# Patient Record
Sex: Female | Born: 1975 | Race: White | Hispanic: Yes | Marital: Married | Smoking: Never smoker
Health system: Southern US, Community
[De-identification: ages and names within clinical notes are randomized; demographics above are authoritative.]

## PROBLEM LIST (undated history)

## (undated) ENCOUNTER — Emergency Department (HOSPITAL_COMMUNITY): Discharge status: Against Medical Advice | Payer: Self-pay

## (undated) DIAGNOSIS — E119 Type 2 diabetes mellitus without complications: Secondary | ICD-10-CM

## (undated) DIAGNOSIS — K8000 Calculus of gallbladder with acute cholecystitis without obstruction: Secondary | ICD-10-CM

## (undated) DIAGNOSIS — I10 Essential (primary) hypertension: Secondary | ICD-10-CM

---

## 2001-05-03 ENCOUNTER — Emergency Department (HOSPITAL_COMMUNITY): Admission: EM | Admit: 2001-05-03 | Discharge: 2001-05-03 | Payer: Self-pay

## 2005-02-28 ENCOUNTER — Ambulatory Visit: Payer: Self-pay | Admitting: *Deleted

## 2005-02-28 ENCOUNTER — Encounter: Payer: Self-pay | Admitting: Obstetrics & Gynecology

## 2005-04-19 ENCOUNTER — Ambulatory Visit: Payer: Self-pay | Admitting: Family Medicine

## 2005-11-27 ENCOUNTER — Ambulatory Visit: Payer: Self-pay | Admitting: Family Medicine

## 2005-11-28 ENCOUNTER — Ambulatory Visit (HOSPITAL_COMMUNITY): Admission: RE | Admit: 2005-11-28 | Discharge: 2005-11-28 | Payer: Self-pay | Admitting: Family Medicine

## 2006-02-08 ENCOUNTER — Encounter: Payer: Self-pay | Admitting: Emergency Medicine

## 2006-02-08 ENCOUNTER — Inpatient Hospital Stay (HOSPITAL_COMMUNITY): Admission: AD | Admit: 2006-02-08 | Discharge: 2006-02-10 | Payer: Self-pay | Admitting: Obstetrics and Gynecology

## 2006-06-10 ENCOUNTER — Inpatient Hospital Stay (HOSPITAL_COMMUNITY): Admission: AD | Admit: 2006-06-10 | Discharge: 2006-06-14 | Payer: Self-pay | Admitting: Obstetrics & Gynecology

## 2006-06-11 ENCOUNTER — Encounter: Payer: Self-pay | Admitting: Obstetrics & Gynecology

## 2007-08-15 ENCOUNTER — Encounter: Payer: Self-pay | Admitting: Family Medicine

## 2007-08-15 ENCOUNTER — Ambulatory Visit: Payer: Self-pay | Admitting: Family Medicine

## 2007-08-15 DIAGNOSIS — R252 Cramp and spasm: Secondary | ICD-10-CM | POA: Insufficient documentation

## 2007-08-15 LAB — CONVERTED CEMR LAB
BUN: 10 mg/dL (ref 6–23)
CO2: 25 meq/L (ref 19–32)
Calcium: 9.1 mg/dL (ref 8.4–10.5)
Chloride: 105 meq/L (ref 96–112)
Creatinine, Ser: 0.85 mg/dL (ref 0.40–1.20)
Glucose, Bld: 93 mg/dL (ref 70–99)
Potassium: 4.5 meq/L (ref 3.5–5.3)
Sodium: 140 meq/L (ref 135–145)

## 2007-09-06 ENCOUNTER — Ambulatory Visit: Payer: Self-pay | Admitting: Family Medicine

## 2007-09-06 DIAGNOSIS — E669 Obesity, unspecified: Secondary | ICD-10-CM | POA: Insufficient documentation

## 2007-09-09 ENCOUNTER — Ambulatory Visit: Payer: Self-pay | Admitting: Family Medicine

## 2007-09-09 ENCOUNTER — Encounter: Payer: Self-pay | Admitting: Family Medicine

## 2007-09-12 ENCOUNTER — Encounter: Payer: Self-pay | Admitting: *Deleted

## 2007-09-12 LAB — CONVERTED CEMR LAB
Cholesterol: 150 mg/dL (ref 0–200)
HDL: 43 mg/dL (ref >39)
LDL Cholesterol: 85 mg/dL (ref 0–99)
Total CHOL/HDL Ratio: 3.5
Triglycerides: 111 mg/dL (ref <150)
VLDL: 22 mg/dL (ref 0–40)

## 2007-10-28 ENCOUNTER — Ambulatory Visit: Payer: Self-pay | Admitting: Family Medicine

## 2008-01-10 ENCOUNTER — Emergency Department (HOSPITAL_COMMUNITY): Admission: EM | Admit: 2008-01-10 | Discharge: 2008-01-10 | Payer: Self-pay | Admitting: Emergency Medicine

## 2008-01-14 ENCOUNTER — Ambulatory Visit: Payer: Self-pay | Admitting: Family Medicine

## 2008-01-14 DIAGNOSIS — R109 Unspecified abdominal pain: Secondary | ICD-10-CM | POA: Insufficient documentation

## 2008-01-14 LAB — CONVERTED CEMR LAB
Bilirubin Urine: NEGATIVE
Blood in Urine, dipstick: NEGATIVE
Glucose, Urine, Semiquant: NEGATIVE
Ketones, urine, test strip: NEGATIVE
Nitrite: NEGATIVE
Protein, U semiquant: NEGATIVE
Specific Gravity, Urine: 1.015
Urobilinogen, UA: 0.2
WBC Urine, dipstick: NEGATIVE
pH: 7

## 2008-01-20 ENCOUNTER — Encounter: Payer: Self-pay | Admitting: Family Medicine

## 2008-01-20 ENCOUNTER — Ambulatory Visit: Payer: Self-pay | Admitting: Family Medicine

## 2008-01-20 DIAGNOSIS — R109 Unspecified abdominal pain: Secondary | ICD-10-CM | POA: Insufficient documentation

## 2008-01-20 LAB — CONVERTED CEMR LAB
Bilirubin Urine: NEGATIVE
Blood in Urine, dipstick: NEGATIVE
Glucose, Urine, Semiquant: NEGATIVE
Ketones, urine, test strip: NEGATIVE
Nitrite: NEGATIVE
Protein, U semiquant: NEGATIVE
Specific Gravity, Urine: 1.015
Urobilinogen, UA: 0.2
WBC Urine, dipstick: NEGATIVE
pH: 6.5

## 2008-01-23 LAB — CONVERTED CEMR LAB
ALT: 25 units/L (ref 0–35)
AST: 17 units/L (ref 0–37)
Albumin: 4.4 g/dL (ref 3.5–5.2)
Alkaline Phosphatase: 85 units/L (ref 39–117)
BUN: 10 mg/dL (ref 6–23)
Basophils Absolute: 0 10*3/uL (ref 0.0–0.1)
Basophils Relative: 0 % (ref 0–1)
CO2: 23 meq/L (ref 19–32)
Calcium: 9.2 mg/dL (ref 8.4–10.5)
Chlamydia, DNA Probe: NEGATIVE
Chloride: 105 meq/L (ref 96–112)
Creatinine, Ser: 0.64 mg/dL (ref 0.40–1.20)
Eosinophils Absolute: 0.1 10*3/uL (ref 0.0–0.7)
Eosinophils Relative: 1 % (ref 0–5)
GC Probe Amp, Genital: NEGATIVE
Glucose, Bld: 87 mg/dL (ref 70–99)
HCT: 42.3 % (ref 36.0–46.0)
Hemoglobin: 14.4 g/dL (ref 12.0–15.0)
Lipase: 15 units/L (ref 0–75)
Lymphocytes Relative: 28 % (ref 12–46)
Lymphs Abs: 2.5 10*3/uL (ref 0.7–4.0)
MCHC: 34 g/dL (ref 30.0–36.0)
MCV: 87.6 fL (ref 78.0–100.0)
Monocytes Absolute: 0.6 10*3/uL (ref 0.1–1.0)
Monocytes Relative: 7 % (ref 3–12)
Neutro Abs: 5.8 10*3/uL (ref 1.7–7.7)
Neutrophils Relative %: 64 % (ref 43–77)
Platelets: 355 10*3/uL (ref 150–400)
Potassium: 4 meq/L (ref 3.5–5.3)
RBC: 4.83 M/uL (ref 3.87–5.11)
RDW: 12.3 % (ref 11.5–15.5)
Sodium: 137 meq/L (ref 135–145)
Total Bilirubin: 0.3 mg/dL (ref 0.3–1.2)
Total Protein: 7.5 g/dL (ref 6.0–8.3)
WBC: 9 10*3/uL (ref 4.0–10.5)

## 2009-03-22 ENCOUNTER — Ambulatory Visit: Payer: Self-pay | Admitting: Family Medicine

## 2009-03-22 ENCOUNTER — Encounter: Payer: Self-pay | Admitting: Family Medicine

## 2009-03-22 DIAGNOSIS — N898 Other specified noninflammatory disorders of vagina: Secondary | ICD-10-CM | POA: Insufficient documentation

## 2009-03-22 LAB — CONVERTED CEMR LAB
Beta hcg, urine, semiquantitative: NEGATIVE
Bilirubin Urine: NEGATIVE
Blood in Urine, dipstick: NEGATIVE
Glucose, Urine, Semiquant: NEGATIVE
Nitrite: NEGATIVE
Specific Gravity, Urine: 1.025
Urobilinogen, UA: 1
WBC Urine, dipstick: NEGATIVE
Whiff Test: NEGATIVE
pH: 6.5

## 2009-03-23 ENCOUNTER — Encounter: Payer: Self-pay | Admitting: Family Medicine

## 2009-03-23 LAB — CONVERTED CEMR LAB
Chlamydia, Swab/Urine, PCR: NEGATIVE
GC Probe Amp, Urine: NEGATIVE

## 2009-03-24 ENCOUNTER — Telehealth: Payer: Self-pay | Admitting: *Deleted

## 2009-04-05 ENCOUNTER — Ambulatory Visit: Payer: Self-pay | Admitting: Family Medicine

## 2009-04-05 DIAGNOSIS — B373 Candidiasis of vulva and vagina: Secondary | ICD-10-CM | POA: Insufficient documentation

## 2009-04-05 LAB — CONVERTED CEMR LAB: Whiff Test: NEGATIVE

## 2009-05-19 ENCOUNTER — Encounter: Payer: Self-pay | Admitting: Family Medicine

## 2009-05-19 ENCOUNTER — Ambulatory Visit: Payer: Self-pay | Admitting: Family Medicine

## 2009-05-19 DIAGNOSIS — R42 Dizziness and giddiness: Secondary | ICD-10-CM | POA: Insufficient documentation

## 2009-05-19 HISTORY — DX: Dizziness and giddiness: R42

## 2009-05-20 LAB — CONVERTED CEMR LAB
ALT: 25 units/L (ref 0–35)
AST: 18 units/L (ref 0–37)
Albumin: 4.3 g/dL (ref 3.5–5.2)
Alkaline Phosphatase: 69 units/L (ref 39–117)
BUN: 13 mg/dL (ref 6–23)
Basophils Absolute: 0 10*3/uL (ref 0.0–0.1)
Basophils Relative: 0 % (ref 0–1)
CO2: 22 meq/L (ref 19–32)
Calcium: 9.1 mg/dL (ref 8.4–10.5)
Chloride: 108 meq/L (ref 96–112)
Creatinine, Ser: 0.78 mg/dL (ref 0.40–1.20)
Eosinophils Absolute: 0.1 10*3/uL (ref 0.0–0.7)
Eosinophils Relative: 1 % (ref 0–5)
Glucose, Bld: 88 mg/dL (ref 70–99)
HCT: 43.1 % (ref 36.0–46.0)
Hemoglobin: 14.4 g/dL (ref 12.0–15.0)
Lymphocytes Relative: 25 % (ref 12–46)
Lymphs Abs: 1.7 10*3/uL (ref 0.7–4.0)
MCHC: 33.4 g/dL (ref 30.0–36.0)
MCV: 89 fL (ref 78.0–100.0)
Monocytes Absolute: 0.6 10*3/uL (ref 0.1–1.0)
Monocytes Relative: 9 % (ref 3–12)
Neutro Abs: 4.3 10*3/uL (ref 1.7–7.7)
Neutrophils Relative %: 65 % (ref 43–77)
Platelets: 310 10*3/uL (ref 150–400)
Potassium: 4 meq/L (ref 3.5–5.3)
RBC: 4.84 M/uL (ref 3.87–5.11)
RDW: 12.6 % (ref 11.5–15.5)
Sodium: 139 meq/L (ref 135–145)
TSH: 1.296 microintl units/mL (ref 0.350–4.500)
Total Bilirubin: 0.4 mg/dL (ref 0.3–1.2)
Total Protein: 7.2 g/dL (ref 6.0–8.3)
WBC: 6.6 10*3/uL (ref 4.0–10.5)

## 2009-05-31 ENCOUNTER — Ambulatory Visit: Payer: Self-pay | Admitting: Family Medicine

## 2009-05-31 DIAGNOSIS — H9190 Unspecified hearing loss, unspecified ear: Secondary | ICD-10-CM | POA: Insufficient documentation

## 2009-05-31 HISTORY — DX: Unspecified hearing loss, unspecified ear: H91.90

## 2009-06-10 ENCOUNTER — Encounter: Payer: Self-pay | Admitting: Family Medicine

## 2010-02-08 NOTE — Progress Notes (Signed)
----   Converted from flag ---- ---- 03/23/2009 5:39 PM, Kelly Boyden  MD wrote: please send letter to patient. ------------------------------  letter mailed.

## 2010-02-08 NOTE — Assessment & Plan Note (Signed)
Summary: vag problems//Kelly Serrano   Vital Signs:  Patient profile:   35 year old female Height:      63.75 inches Weight:      181 pounds BMI:     31.43 Temp:     98.4 degrees F oral Pulse rate:   96 / minute BP sitting:   136 / 88  (right arm) Cuff size:   regular  Vitals Entered By: Tessie Fass, CMA (March 22, 2009 2:51 PM) CC: LUQ abdominal pain, burning urination Pain Assessment Patient in pain? yes     Location: abdomen   Primary Care Provider:  Eustaquio Boyden  MD  CC:  LUQ abdominal pain and burning urination.  History of Present Illness: CC: abd pain  1. abd pain - h/o LUQ pain in past, previous w/u WNL.  3 wk h/o LUQ pain.  + mild nausea, no vomiting.  No diarrhea, constipation.  No new foods.  Pain worse after stooling, not with stooling and worse with food, all types.  No sick contacts at home.  Normal appetite.  No recent changes in weight.  Not temporal.  Pain worse with laying down.  No fevers.  + indigestion and gas.  + mild reflux.  Feels better with ibuprofen.  no change with sodas, + worsening with caffeine.  Normally has one soft BM a day.  No blood in stool or urine.  2lb weight gain since last year.  2. vaginal itching - + dysuria and burning with itching.  + vaginal discharge brown.  LMP 03/05/2009.  normally very regular 28 d cycles, 2 days of heavy bleeding and 2 days of light bleeding.  Itching started after period.  with period also has RLQ pain.  Habits & Providers  Alcohol-Tobacco-Diet     Tobacco Status: never  Allergies: No Known Drug Allergies  Past History:  Past medical, surgical, family and social histories (including risk factors) reviewed for relevance to current acute and chronic problems.  Past Medical History: Reviewed history from 01/20/2008 and no changes required. G1P0, preeclampsia on Mg, GDM during pregnancy 06/11/2006 Cholelitihasis per patient head CT normal 01/2008  Past Surgical History: Reviewed history from  09/06/2007 and no changes required. transvaginal US - IUP 11 weeks - 11/29/2005 Csection - FTP  Family History: Reviewed history from 09/06/2007 and no changes required. DM,  No HTN, CA, CVA, MI  Social History: Reviewed history from 09/06/2007 and no changes required. [place] with husband and daughter Verdon Cummins), no smoking, EtOH, drugs, rarely does exercise  Physical Exam  General:  Well-developed,well-nourished,in no acute distress; alert,appropriate and cooperative throughout examination, overweight-appearing.   Mouth:  Oral mucosa and oropharynx without lesions or exudates.  Teeth in good repair. Lungs:  Normal respiratory effort, chest expands symmetrically. Lungs are clear to auscultation, no crackles or wheezes. Heart:  Normal rate and regular rhythm. S1 and S2 normal without gallop, murmur, click, rub or other extra sounds. Abdomen:  soft, ND, obese, NABS, epigastric pain to palpation, BLQ pain to palpation.  No CVA tenderness.  No rebound or guarding.  no masses noted. Genitalia:  Pelvic Exam:        External: normal female genitalia without lesions or masses        Vagina: normal without lesions or masses, + white discharge        Cervix: normal without lesions or masses, blood in cervical os.        Adnexa: normal bimanual exam without masses or fullness  Uterus: normal by palpation        Pap smear: not performed Extremities:  No clubbing, cyanosis, edema, or deformity noted with normal full range of motion of all joints.   Skin:  Intact without suspicious lesions or rashes   Impression & Recommendations:  Problem # 1:  VAGINAL DISCHARGE (ICD-623.5) check wet prep - negative.  Upreg negative.  check CT/GC to r/o PID.  Treat as yeast infection with diflucan.  Orders: Wet Prep- [facility] [phone]) [facility]- Est  Level 4 (99214) GC/Chlamydia-[facility] (87591/87491)  Problem # 2:  ABDOMINAL PAIN (ICD-789.00) UA WNL.  will treat as reflux with PPI.  if not improving consider RUQ  [place] to eval cholelithiasis, lab w/u (CMP, lipase, CBC).  (last done 01/2008 and WNL).  Consider TVS to eval uterine/ovarian pathology.  RTC 2-3 weeks.  Orders: Urinalysis-[facility] (00000) U Preg-[facility] (81025) [facility]- Est  Level 4 (99214)  Complete Medication List: 1)  Omeprazole 40 Mg Cpdr (Omeprazole) .... One daily (label in spanish) 2)  Fluconazole 150 Mg Tabs (Fluconazole) .... Take one by mouth x 1  Patient Instructions: 1)  Regresar en 2-3 semanas para ver como sigue. 2)  Su examen de orina estuvo normal.  Asegurese de tomar [place]. 3)  Su examen vaginal estuvo normal y de IT consultant negativo (normal). 4)  La trataremos como gastritis (problema de sobre-produccion de acido en Hospital doctor - para esto tome TUMS de farmacia y Omeprazole 40mg  diarios por 3 semanas. 5)  Abstengase de comida grasosa, comida picante, comida acidica como citricos y soda (refresco).  Trate de comer al minimo 2 horas antes de acostarse.  Puede tratar de subir la cabeza de su cama para ayudar con reflujo. 6)  Llame a clinica con preguntas. 7)  Si no se mejora, en 2-3 semanas haremos ultrasonido. 8)  Si empeora (fiebre, mas grave dolor de [place]), porfavor regresar aqui o ir a urgencias si estamos cerrados. Prescriptions: FLUCONAZOLE 150 MG TABS (FLUCONAZOLE) take one by mouth x 1  #1 x 0   Entered and Authorized by:   Eustaquio Boyden  MD   Signed by:   Eustaquio Boyden  MD on 03/22/2009   Method used:   Electronically to        [facility] #[id number]* (retail)       [address] [place], [place]  [address]       Ph: [phone]       Fax: [phone]   RxID:   [id number] OMEPRAZOLE 40 MG CPDR (OMEPRAZOLE) one daily (label in spanish)  #30 x 3   Entered and Authorized by:   Eustaquio Boyden  MD   Signed by:   Eustaquio Boyden  MD on 03/22/2009   Method used:   Electronically to        [facility] #[id number]* (retail)       [address] [place], [place]  [address]        Ph: [phone]       Fax: [phone]   RxID:   [id number]   Laboratory Results   Urine Tests  Date/Time Received: March 22, 2009 2:59 PM  Date/Time Reported: March 22, 2009 3:45 PM    Routine Urinalysis   Color: yellow Appearance: Clear Glucose: negative   (Normal Range: Negative) Bilirubin: negative   (Normal Range: Negative) Ketone: trace (5)   (Normal Range: Negative) Spec. Gravity: 1.025   (Normal Range:  1.003-1.035) Blood: negative   (Normal Range: Negative) pH: 6.5   (Normal Range: 5.0-8.0) Protein: trace   (Normal Range: Negative) Urobilinogen: 1.0   (Normal Range: 0-1) Nitrite: negative   (Normal Range: Negative) Leukocyte Esterace: negative   (Normal Range: Negative)    Urine HCG: negative Comments: ...........test performed by...........Marland KitchenTerese Door, CMA  Date/Time Received: March 22, 2009 3:37 PM  Date/Time Reported: March 22, 2009 3:54 PM   [employer] Source: vaginal WBC/hpf: 10-20 Bacteria/hpf: 3+  Rods Clue cells/hpf: none  Negative whiff Yeast/hpf: none Trichomonas/hpf: none Comments: >20 RBC's present  ...........test performed by...........Marland KitchenTerese Door, CMA

## 2010-02-08 NOTE — Letter (Signed)
Summary: Generic Letter  [facility]  [address]   [place], [place] [address]   Phone: [phone]  Fax: [phone]    03/23/2009  [facility] [address] [place], [place]  [address]  Dear Ms. [name],  Todos sus examenes de su visita del Lunes salieron normales.   Porfavor llamar a clinica y darnos un numero que funcione que el que [place], [phone], no funciona.  Llame a clinica con preguntas.   Sincerely,   Eustaquio Boyden  MD

## 2010-02-08 NOTE — Letter (Signed)
Summary: Generic Letter  [facility]  [address]   [place], [place] [address]   Phone: [phone]  Fax: [phone]    06/10/2009  [facility] [name] [address] [place], [place]  [address]  Dear Ms. [name],   Tiene Mirena depositivo aqui en clinica.  El precio para tenerlo puesto es cerca de $220.  Porfavor llamenos para ver se todavia quiere el depositivo.   Llame a clinica con preguntas.     Sincerely,   Eustaquio Boyden  MD  Appended Document: Generic Letter MAILED.

## 2010-02-08 NOTE — Assessment & Plan Note (Signed)
Summary: FLU SHOT/GN  Nurse Visit    Current Allergies: No known allergies    Influenza Vaccine    Vaccine Type: Fluvax Non-MCR    Site: right deltoid    Mfr: GlaxoSmithKline    Dose: 0.5 ml    Route: IM    Given by: ADINA GOULD    Exp. Date: 07/08/2008    Lot #: [id number]    VIS given: 08/02/06 version given October 28, 2007.  Flu Vaccine Consent Questions    Do you have a history of severe allergic reactions to this vaccine? no    Any prior history of allergic reactions to egg and/or gelatin? no    Do you have a sensitivity to the preservative Thimersol? no    Do you have a past history of Guillan-Barre Syndrome? no    Do you currently have an acute febrile illness? no    Have you ever had a severe reaction to latex? no    Vaccine information given and explained to patient? yes    Are you currently pregnant? no   Orders Added: 1)  Influenza Vaccine NON MCR [00028]    ]

## 2010-02-08 NOTE — Assessment & Plan Note (Signed)
Summary: EAR BUZZING, BP   Vital Signs:  Patient Profile:   35 Years Old Female Weight:      179.5 pounds Temp:     98.2 degrees F Pulse rate:   96 / minute BP sitting:   132 / 89  (left arm)  Pt. in pain?   no  Vitals Entered By: Neena Rhymes  MD (August 15, 2007 4:30 PM)              Is Patient Diabetic? No     Chief Complaint:  r ear pain and buzzing.  History of Present Illness: Pt is 35 yo woman who states she has had  ~1 week of R ear 'buzzing' and decreased hearing.  No sxs on L side.  No fevers, chills, nasal congestion.  Also c/o intermittant cramps in her legs.  States she has had problems since delivering her baby over a year ago.  Sxs worse at night when they occur.  No increase in activity, no medications which would alter electrolytes.  Pt's 'oh by the way' today was she states she is concerned about a brain tumor.  She fears this b/c she notes that her L arm intermittantly will go numb when she's holding her child (who is not > 36 year old).      Risk Factors:  Tobacco use:  never   Review of Systems      See HPI   Physical Exam  General:     Well-developed,well-nourished,in no acute distress; alert,appropriate and cooperative throughout examination Head:     Normocephalic and atraumatic without obvious abnormalities. No apparent alopecia or balding. Eyes:     EOMI, PERRL Ears:     R TM obscured by cerumen impaction.  Wax removed and subsequent exam shows normal TM Neck:     supple w/out adenopathy, FROM Lungs:     Normal respiratory effort, chest expands symmetrically. Lungs are clear to auscultation, no crackles or wheezes. Heart:     Normal rate and regular rhythm. S1 and S2 normal without gallop, murmur, click, rub or other extra sounds. Msk:     LEs not TTP Pulses:     +2 carotid, radial, DP pulses Extremities:     No clubbing, cyanosis, edema, or deformity noted with normal full range of motion of all joints.   Neurologic:  alert & oriented X3, cranial nerves II-XII intact, strength normal in all extremities, and DTRs symmetrical and normal.   Psych:     slightly anxious.      Impression & Recommendations:  Problem # 1:  CERUMEN IMPACTION (ICD-380.4) Assessment: New Pt's ear sxs resolved following wax removal. Orders: [facility]- Est Level  3 (95188) Cerumen Impaction Removal-[facility] (41660)   Problem # 2:  CRAMP OF LIMB (ICD-729.82) Assessment: New Pt's cramps likely due to dehydration or possibly the strain of carrying her child.  Will check electrolytes to make sure K+ is WNL.  Provided reassurance to pt that given her PE she is unlikely to have a brain tumor- neuro exam intact, sxs are intermittant and when she is holding her child in that arm.  Told her that given her level of concern about this issue she should f/u in order to discuss these issues in more detail, rather than rushed at the end of her visit.  Pt in agreement and will f/u w/ Dr Sharen Hones Orders: [facility]- Est Level  3 (63016) Basic Met-[facility] [phone])     ]

## 2010-02-08 NOTE — Assessment & Plan Note (Signed)
Summary: dizziness x 3 weeks   Vital Signs:  Patient profile:   35 year old female Weight:      177.5 pounds Temp:     98.5 degrees F oral Pulse rate:   91 / minute Pulse (ortho):   85 / minute Pulse rhythm:   regular BP sitting:   128 / 77  (right arm) BP standing:   127 / 86 Cuff size:   regular  Vitals Entered By: Loralee Pacas CMA (May 19, 2009 3:37 PM)  Serial Vital Signs/Assessments:  Time      Position  BP       Pulse  Resp  Temp     By 414       Lying RA  124/81   90                    Tonya Barkley CMA 414       Lying LA  131/85   83                    Tonya Barkley CMA 414       Sitting   134/87   80                    Tonya Barkley CMA 414       Standing  127/86   85                    Tonya Barkley CMA  CC: dizziness Is Patient Diabetic? No Comments pt stated that she is not having headaches only dizziness and she feels that way all of the time for the past 3 weeks but not everyday. it looks like things are moving when she looks at them.  her ears are ringing.  she checked her bp earlier 157/90  Vision Screening:Left eye w/o correction: 20 / 25 Both eyes w/o correction:  20/ 25       Vision Comments: pt could not visualize out of right eye everything was jumping around  Vision Entered By: Loralee Pacas CMA (May 19, 2009 3:40 PM)   Primary Care Provider:  Eustaquio Boyden  MD  CC:  dizziness.  History of Present Illness: 35yo F w/ dizziness  Dizziness: x 3 weeks.  Course unchanged.  Reports feeling "unsteady".  No vertigo but has associated "ringing of the ears??" and somewhat "muffled hearing per translator".  No associated headaches.  Reports some nausea and abnl vision.  No syncopal events.  No hx of trauma or event to cause her symptoms.  No new medications.  Denies symptoms with changes in position.  No recent illness.  Current Medications (verified): 1)  Clotrimazole 3 Day 2 % Crea (Clotrimazole) .... Sig: Apply Per Vagina One Time Each Night For 3  Nights Disp Quant Sufficient 3 Days Instructions in Spanish 2)  Meclizine Hcl 25 Mg Tabs (Meclizine Hcl) .Marland Kitchen.. 1 Tab By Mouth Three Times A Day For Dizziness 3)  Zofran 4 Mg Tabs (Ondansetron Hcl) .Marland Kitchen.. 1 Tab By Mouth Q6h As Needed For Nausea  Allergies (verified): No Known Drug Allergies  Past History:  Past Medical History: Last updated: 01/20/2008 G1P0, preeclampsia on Mg, GDM during pregnancy 06/11/2006 Cholelitihasis per patient head CT normal 01/2008  Review of Systems      See HPI  Physical Exam  General:  VS Reviewed. Well appearing, NAD.  Head:  normocephalic and atraumatic.   Eyes:  No corneal or conjunctival inflammation  noted. EOMI. Perrla. Funduscopic exam benign, without hemorrhages, exudates or papilledema. Vision grossly normal. Ears:  External ear exam shows no significant lesions or deformities.  Otoscopic examination reveals clear canals, tympanic membranes are intact bilaterally without bulging, retraction, inflammation or discharge. Hearing is grossly normal bilaterally. Neck:  supple, full ROM, no goiter or mass  Lungs:  Normal respiratory effort, chest expands symmetrically. Lungs are clear to auscultation, no crackles or wheezes. Heart:  Normal rate and regular rhythm. S1 and S2 normal without gallop, murmur, click, rub or other extra sounds. Neurologic:  alert & oriented X3, cranial nerves II-XII intact, 1-2 beats of nystagmus, strength normal in all extremities, sensation intact to light touch, sensation intact to pinprick, gait normal, DTRs symmetrical and normal, finger-to-nose normal, and Romberg negative.     Impression & Recommendations:  Problem # 1:  DIZZINESS (ICD-780.4) Assessment New x 3 weeks.   Uncertain of etiology at this time.  No true objective neurological deficits except for 1-2 beats of nystagmus.   Plan to check CMET, CBC, and TSH to r/o electrolyte abnormality, anemia, or abnl thyroid function. Pt discussed briefly with Dr. Sharen Hones  and he mention possible somatization. Will treat symptoms at this time and have her f/u with Dr. Sharen Hones to discuss lab results and reevaluate.  Her updated medication list for this problem includes:    Meclizine Hcl 25 Mg Tabs (Meclizine hcl) .Marland Kitchen... 1 tab by mouth three times a day for dizziness    Zofran 4 Mg Tabs (Ondansetron hcl) .Marland Kitchen... 1 tab by mouth q6h as needed for nausea  Orders: Comp Met-[facility] ([id number]) CBC w/Diff-[facility] (14782) TSH-[facility] (95621-30865) [facility]- Est  Level 4 (78469)  Complete Medication List: 1)  Clotrimazole 3 Day 2 % Crea (Clotrimazole) .... Sig: apply per vagina one time each night for 3 nights disp quant sufficient 3 days instructions in spanish 2)  Meclizine Hcl 25 Mg Tabs (Meclizine hcl) .Marland Kitchen.. 1 tab by mouth three times a day for dizziness 3)  Zofran 4 Mg Tabs (Ondansetron hcl) .Marland Kitchen.. 1 tab by mouth q6h as needed for nausea  Patient Instructions: 1)  Please schedule a follow-up appointment in 2 weeks with your primary doctor. 2)  I started you on Meclizine for the dizziness and zofran for the nausea. 3)  We will discuss your lab work at your next visit. Prescriptions: ZOFRAN 4 MG TABS (ONDANSETRON HCL) 1 tab by mouth q6h as needed for nausea  #30 x 0   Entered and Authorized by:   Marisue Ivan  MD   Signed by:   Marisue Ivan  MD on 05/19/2009   Method used:   Electronically to        [facility] #[id number]* (retail)       [address] [place], [place]  [address]       Ph: [phone]       Fax: [phone]   RxID:   [id number] MECLIZINE HCL 25 MG TABS (MECLIZINE HCL) 1 tab by mouth three times a day for dizziness  #42 x 0   Entered and Authorized by:   Marisue Ivan  MD   Signed by:   Marisue Ivan  MD on 05/19/2009   Method used:   Electronically to        [facility] #[id number]* (retail)       [address]       [place], [place]  [address]  Ph: [phone]       Fax: [phone]   RxID:    [id number]

## 2010-02-08 NOTE — Assessment & Plan Note (Signed)
Summary: f/u of dizziness and new hearing deficit   Vital Signs:  Patient profile:   35 year old female Height:      63.75 inches Weight:      176 pounds BMI:     30.56 Temp:     98.6 degrees F oral Pulse rate:   85 / minute BP sitting:   127 / 80  (left arm) Cuff size:   regular  Vitals Entered By: Tessie Fass CMA (May 31, 2009 4:05 PM) CC: hearing problems  Hearing Screen  20db HL: Left  500 hz: No Response 1000 hz: No Response 2000 hz: 20db 4000 hz: 20db Right  500 hz: 25db 1000 hz: 40db 2000 hz: 20db 4000 hz: 20db   Hearing Testing Entered By: Tessie Fass CMA (May 31, 2009 4:48 PM)   Primary Care Provider:  Eustaquio Boyden  MD  CC:  hearing problems.  History of Present Illness: 35yo F here for f/u of previous dizziness  Dizziness:  > 5 wks.  Course improved.  Reports feeling "unsteady".  No vertigo but has associated "ringing of the ears??" and worsenedt "muffled hearing per translator".  No associated headaches.  No syncopal events.  No hx of trauma or event to cause her symptoms. Has not picked up any of the medications prescribed.  Denies symptoms with changes in position.  No recent illness.  Current Medications (verified): 1)  Clotrimazole 3 Day 2 % Crea (Clotrimazole) .... Sig: Apply Per Vagina One Time Each Night For 3 Nights Disp Quant Sufficient 3 Days Instructions in Spanish 2)  Meclizine Hcl 25 Mg Tabs (Meclizine Hcl) .Marland Kitchen.. 1 Tab By Mouth Three Times A Day For Dizziness 3)  Zofran 4 Mg Tabs (Ondansetron Hcl) .Marland Kitchen.. 1 Tab By Mouth Q6h As Needed For Nausea  Allergies (verified): No Known Drug Allergies  Review of Systems      See HPI  Physical Exam  General:  VS Reviewed. Well appearing, NAD.  Head:  normocephalic and atraumatic.   Eyes:  No corneal or conjunctival inflammation noted. EOMI. Perrla. Funduscopic exam benign, without hemorrhages, exudates or papilledema. Vision grossly normal. Ears:  L ear: mild ttp of tragus; no ttp of pinna or  mastoid; nl external canal and TM; no erythema, bulging, or fluid R ear- nl exam Neck:  supple  full ROM Lungs:  Normal respiratory effort, chest expands symmetrically. Lungs are clear to auscultation, no crackles or wheezes. Heart:  Normal rate and regular rhythm. S1 and S2 normal without gallop, murmur, click, rub or other extra sounds. Neurologic:  no focal deficits     Impression & Recommendations:  Problem # 1:  DIZZINESS (ICD-780.4) Assessment Improved  Symptoms mildly improved from previous visit without any medical intervention.  Still not truly c/w vertigo.  Now concerning for possible meneire's disease b/w tinnitus and muffled hearing.   Will have her hearing tested before any further treatment or intervention. If meneire's will treat with HCTZ/triamterene combo if bp tolerates it.  Her updated medication list for this problem includes:    Meclizine Hcl 25 Mg Tabs (Meclizine hcl) .Marland Kitchen... 1 tab by mouth three times a day for dizziness    Zofran 4 Mg Tabs (Ondansetron hcl) .Marland Kitchen... 1 tab by mouth q6h as needed for nausea  Orders: [facility]- Est Level  3 (14782)  Problem # 2:  HEARING DEFICIT (ICD-389.9) Assessment: New L ear hearing deficit in the clinic. Will refer to audiology for further evaluation.  Orders: Audiology (Audio) [facility]- Est Level  3 (  29562)  Complete Medication List: 1)  Clotrimazole 3 Day 2 % Crea (Clotrimazole) .... Sig: apply per vagina one time each night for 3 nights disp quant sufficient 3 days instructions in spanish 2)  Meclizine Hcl 25 Mg Tabs (Meclizine hcl) .Marland Kitchen.. 1 tab by mouth three times a day for dizziness 3)  Zofran 4 Mg Tabs (Ondansetron hcl) .Marland Kitchen.. 1 tab by mouth q6h as needed for nausea  Patient Instructions: 1)  I am referring you to audiology for hearing evaluation. 2)  Follow up with your primary doctor after completion of the hearing evaluation. 3)  La estoy refiriendo a Field seismologist en Audio para una evaluacion y despues tendra una cita de  seguimiento con su Doctor primario para que le de seguimiento despues de su evaluacion de audio.

## 2010-02-08 NOTE — Assessment & Plan Note (Signed)
Summary: LUQ pain x 5 days   Vital Signs:  Patient Profile:   35 Years Old Female Height:     63.75 inches Weight:      178.1 pounds Temp:     98.3 degrees F Pulse rate:   88 / minute BP sitting:   122 / 83  (left arm)  Pt. in pain?   yes    Location:   LUQ abdomen    Intensity:   3  Vitals Entered By: Jacki Cones RN (January 20, 2008 3:53 PM)                  PCP:  Eustaquio Boyden  MD  Chief Complaint:  luq abdominal pain.  History of Present Illness: Two evenings ago around 10 PM she developed 10/10 left flank and LUQ pain followed by 4 formed bowel movements without signs of blood. There was chilling, but no fever or sweating, but she vomited x 1 with some red material. The pain persists 3/10, but she is able to eat and sleep and had a normal bowel movement this A.M. No urinary symptoms or history of renal stones in herself or her family, but she knows she has gall stones which cause pain in the RUQ after some meals.   She denies recurrence of the LLQ pain for which she was seen here one week ago. She denies vaginal discharge or discomfort with intercourse which she last had two weeks ago.  Using condoms for contraception  She visited the ER around Jan 1 for headache and had a normal head CT    Current Allergies: No known allergies   Past Medical History:    G1P0, preeclampsia on Mg, GDM during pregnancy 06/11/2006    Cholelitihasis per patient    head CT normal 01/2008       Physical Exam  General:     Well-developed,well-nourished,in no acute distress; alert,appropriate and cooperative throughout examinationoverweight-appearing.   Lungs:     Normal respiratory effort, chest expands symmetrically. Lungs are clear to auscultation, no crackles or wheezes. Heart:     Normal rate and regular rhythm. S1 and S2 normal without gallop, murmur, click, rub or other extra sounds. Abdomen:     soft and LUQ tenderness.  Flank pain localized just above the left CVA area.  Not noticeablly tender to percussion. Transverse lower abdomen scar.  Rectal:     No external abnormalities noted. Normal sphincter tone. No rectal masses or tenderness. Brown guaiac neg stool Genitalia:     Normal introitus for age, no external lesions, no vaginal discharge, mucosa pink and moist, no vaginal  lesions, no friaility or hemorrhage, normal uterus size. Ectropion, but no purulent discharge. Difficulty relaxing. ++ cervical motion tenderness and tender over the left adnexae.     Impression & Recommendations:  Problem # 1:  ABDOMINAL PAIN (ICD-789.00) This may be IBS, but without a history of this and the recent lower abdomen pain we will rule out PID. Also rule out pancreatitis related to a passed gall stone, but no recent RUQ pain.  Orders: Urinalysis-[facility] (00000) [facility]- Est  Level 4 (99214) CBC w/Diff-[facility] (62130) Comp Met-[facility] [phone]) Lipase-[facility] [phone])   Other Orders: GC/Chlamydia-[facility] ([id number]) Pap Smear-[facility] ([id number])   Patient Instructions: 1)  Voy a contactarle con los resultados de sus [place]. La muestra de orina fue normal. No indico' infeccion en la orina ni piedra en la via [place]. Puede tomar Acetaminophen 500 mg cada 6 horas para el dolor  Laboratory Results   Urine Tests  Date/Time Received: January 20, 2008 4:08 PM  Date/Time Reported: January 20, 2008 4:30 PM   Routine Urinalysis   Color: yellow Appearance: Clear Glucose: negative   (Normal Range: Negative) Bilirubin: negative   (Normal Range: Negative) Ketone: negative   (Normal Range: Negative) Spec. Gravity: 1.015   (Normal Range: 1.003-1.035) Blood: negative   (Normal Range: Negative) pH: 6.5   (Normal Range: 5.0-8.0) Protein: negative   (Normal Range: Negative) Urobilinogen: 0.2   (Normal Range: 0-1) Nitrite: negative   (Normal Range: Negative) Leukocyte Esterace: negative   (Normal Range: Negative)    Comments: ...........test performed by...........Marland KitchenTerese Door, CMA

## 2010-02-08 NOTE — Assessment & Plan Note (Signed)
Summary: FU VISIT/[facility]   Vital Signs:  Patient profile:   35 year old female Height:      63.75 inches Weight:      179 pounds BMI:     31.08 BSA:     1.86 Temp:     98.6 degrees F Pulse rate:   88 / minute BP sitting:   136 / 85  Vitals Entered By: Jone Baseman CMA (April 05, 2009 2:14 PM) CC: f/u last visit Is Patient Diabetic? No Pain Assessment Patient in pain? yes     Location: left wside Intensity: 8   Primary Care Provider:  Eustaquio Boyden  MD  CC:  f/u last visit.  History of Present Illness: Visit conducted in [place].   Kelly Serrano reports continued vaginal discharge after last visit.  Used the oral diflucan and got mild temporary relief for 2 days, then resumption of symptoms.  No dyspareunia.  No bleeding.  No history of STIs.  Is monogamous with husband of many years, no reason to doubt this relationship.    Reviewed workup from last visit, cervical cx, UA, and wet mount.   Continues with focalized LUQ abd pain that is worse after meals, and after defecation.  Has had some constipation recently.  Irregular bowel movements; historically has one BM a day.    LMP 3/19-24/2011.  Surgical HIstory s/p c-section June 2008.  Habits & Providers  Alcohol-Tobacco-Diet     Tobacco Status: never  Allergies: No Known Drug Allergies  Physical Exam  General:  Well-developed,well-nourished,in no acute distress; alert,appropriate and cooperative throughout examination Mouth:  moist mucus membranes.  Neck:  No deformities, masses, or tenderness noted. Lungs:  Normal respiratory effort, chest expands symmetrically. Lungs are clear to auscultation, no crackles or wheezes. Heart:  Normal rate and regular rhythm. S1 and S2 normal without gallop, murmur, click, rub or other extra sounds. Abdomen:  Bowel sounds positive,abdomen soft and non-tender without masses, organomegaly or hernias noted. Genitalia:  Normal introitus for age, no external lesions, presence of white  thin vaginal discharge, mucosa pink and moist, no vaginal or cervical lesions, no vaginal atrophy, no friaility or hemorrhage, normal uterus size and position, no adnexal masses or tenderness   Impression & Recommendations:  Problem # 1:  CANDIDIASIS OF VULVA AND VAGINA (ICD-112.1)  Wet mount wiht evidence of yeast.  To change to treatment using topical/vaginal application clotrimazole. No further workup today.  The following medications were removed from the medication list:    Fluconazole 150 Mg Tabs (Fluconazole) .Marland Kitchen... Take one by mouth x 1  Orders: [facility]- Est Level  3 (06237)  Problem # 2:  ABDOMINAL PAIN (ICD-789.00)  Location of abd pain (always in LUQ) not consistent with CCY.  I suspect that constipation and bloating is more likely explanation.  Has not been crescendoeing.  Would try fiber and fluids first, as wellas bowel training.  Reevaluate in the coming few weeks and only consider imaging if higher likelihood of revealing a cause for symptoms. Discussed with patient and she agrees to this approach.  Discussed red flags for more urgent evaluation.   Orders: [facility]- Est Level  3 (62831)  Complete Medication List: 1)  Omeprazole 40 Mg Cpdr (Omeprazole) .... One daily (label in spanish) 2)  Clotrimazole 3 Day 2 % Crea (Clotrimazole) .... Sig: apply per vagina one time each night for 3 nights disp quant sufficient 3 days instructions in spanish  [facility]  Patient Instructions: 1)  Fue Psychiatrist  verle hoy en el consultorio.  2)  Creo que el dolor abdominal se debe al estrenimiento.  Quiero que tome bastante liquidos todos los dias.  3)  Ademas, quiero que compre un suplemento de Research scientist [employer]life sciences) en polvo (como Metamucil), para mezclar una cucharada grande de [place] en un vaso de agua, y tomarlo una vez por dia.  4)  Trate que evacuar todos los dias al mismo horario, para acostumbrarle al colon a que haga su funcion todos los dias.  5)  2.  Para la comezon vaginal,  quiero que use una crema vaginal (clotrimazole), aplique por vagina cada noche por siete dias. 6)  3. Para la cadera, creo que tiene una condicion que se llama de bursitis.  Quiero que use una compresa caliente en la cadera varias veces diario, y que tome 3 tabletas de ibuprofen 200mg  cada tableta (total de 600mg  cada dosis), cada 8 horas por una semana.   7)  4. Marque una cita en 1 a 2 semanas para ver si esta' mejor.  Prescriptions: CLOTRIMAZOLE 3 DAY 2 % CREA (CLOTRIMAZOLE) SIG: Apply per vagina one time each night for 3 nights DISP Quant sufficient 3 days Instructions in Spanish  #1 x 1   Entered and Authorized by:   Paula Compton MD   Signed by:   Paula Compton MD on 04/05/2009   Method used:   Electronically to        [facility] [address] [id number] (retail)       [address] [place], [place]  [address]       Ph: [phone]       Fax: [phone]   RxID:   [id number]   Laboratory Results  Date/Time Received: April 05, 2009 2:46 PM  Date/Time Reported: April 05, 2009 2:54 PM   [employer] Source: vaginal WBC/hpf: 10-20 Bacteria/hpf: 3+  Rods Clue cells/hpf: none  Negative whiff Yeast/hpf: moderate Trichomonas/hpf: none Comments: ...........test performed by...........Marland KitchenTerese Door, CMA

## 2010-02-08 NOTE — Letter (Signed)
Summary: Results Follow-up Letter  [facility]  [address]   [place], [place] [address]   Phone: [phone]  Fax: [phone]    09/12/2007  [address] [place], [place]  [address]  Dear Ms. [name],   Su examen de cholesterol salio normal.  El buen cholesterol, HDL fue 43 y el malo, LDL, fue 85.  Recomendamos que el bueno sea mas de 40 y el malo sea menos de 130.  Llamenos si tiene alguna pregunta.  Que tenga buen dia y [place].  Sincerely,  Eustaquio Boyden  MD [facility]            Appended Document: Results Follow-up Letter letter mailed

## 2010-02-08 NOTE — Assessment & Plan Note (Signed)
Summary: uti s/s   Vital Signs:  Patient Profile:   35 Years Old Female Height:     63.75 inches Weight:      180.9 pounds Temp:     97.8 degrees F Pulse rate:   90 / minute BP sitting:   134 / 87  (left arm)  Pt. in pain?   yes    Location:   lower abdomen    Intensity:   5  Vitals Entered By: Jacki Cones RN (January 14, 2008 3:27 PM)                  PCP:  Eustaquio Boyden  MD  Chief Complaint:  pain in lower abdomen.  History of Present Illness:  ABDOMINAL PAIN Location: lower abdomen Onset:  3 days ago Description:  sharp, intermittent Modifying factors:  none found - no change with food.  no meds tried.    Symptoms Nausea/Vomiting:  + nausea, no vomiting Diarrhea:  none Constipation: none Melena/BRBPR:  none Hematemesis: none Anorexia:  none Fever/Chills:  on 01/10/08 had some fever and dx with URI at urgent care Jaundice:  none Dysuria: 2 days ago, has resolved.  describes urine as feeling "hot" Back pain:  none Rash: none Weight loss:  none Vaginal bleeding:  none, but did have increase in discharge several days ago that is now gone STD exposure: none but is not using contraception and is sexually active LMP: 12/25/07 Alcohol use: none NSAID use: none  PMH Past Surgeries: C section      Current Allergies: No known allergies      Review of Systems      See HPI   Physical Exam  General:     Well-developed,well-nourished,in no acute distress; alert,appropriate and cooperative throughout examination Abdomen:     Bowel sounds positive,abdomen soft and without masses, organomegaly or hernias noted.  tender diffusely but worse over lower right and left adenexa    Impression & Recommendations:  Problem # 1:  ABDOMINAL PAIN (ICD-789.00) Assessment: New unclear etiology but appears benign.  suspect could be ovulation given location.  could also be viral illness given associated nausea.  advised her to continue to monitor, use tylenol as  needed and return if develops fever or worsening pain.    Orders: Urinalysis-[facility] (00000) [facility]- Est Level  3 ([address])    Patient Instructions: 1)  Your urine was normal 2)  I think your pain is probably a virus infection or your monthly ovulation/egg release.  3)  If you get fever with the pain or the pain gets very severe, please call to get checked again.   ] Laboratory Results   Urine Tests  Date/Time Received: January 14, 2008 4:27 PM  Date/Time Reported: January 14, 2008 4:28 PM   Routine Urinalysis   Color: yellow Appearance: Clear Glucose: negative   (Normal Range: Negative) Bilirubin: negative   (Normal Range: Negative) Ketone: negative   (Normal Range: Negative) Spec. Gravity: 1.015   (Normal Range: 1.003-1.035) Blood: negative   (Normal Range: Negative) pH: 7.0   (Normal Range: 5.0-8.0) Protein: negative   (Normal Range: Negative) Urobilinogen: 0.2   (Normal Range: 0-1) Nitrite: negative   (Normal Range: Negative) Leukocyte Esterace: negative   (Normal Range: Negative)    Comments: ...........test performed by...........Marland KitchenTerese Door, CMA

## 2010-02-08 NOTE — Miscellaneous (Signed)
Summary: HA & dizzy  Clinical Lists Changes pt went to a drugstore & get her bp done. states it is high. also has a HA & is dizzy. told her (using interpretor) that those machines may not b accurate. appt made to check bp & see md-3pm work in. told her to take tylenol or ibu for the HA. cannot come sooner due to ride.[place] RN  May 19, 2009 10:00 AM  thanks. Eustaquio Boyden  MD  May 19, 2009 1:47 PM

## 2010-04-21 IMAGING — CT CT HEAD W/O CM
1 series · 16 of 30 positions shown, 20 images · non-contrast
Comparison: None.

CLINICAL DATA: 32-year-old female with right frontal headache and
nausea.

CT HEAD WITHOUT CONTRAST
TECHNIQUE: Contiguous axial images were obtained from the base of
the skull through the vertex without contrast.

[Series 2: head routine 4.8 h37s · axial · 0.43mm/px · z∈[-69,+66]mm · 16 of 30 slices shown, 20 images]
[im 2/30  brain]
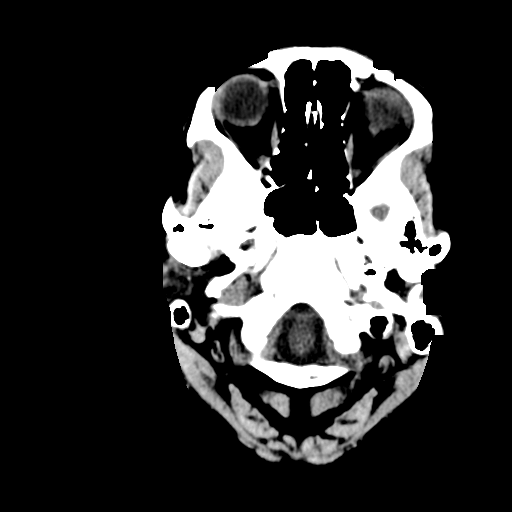
[im 2/30  bone]
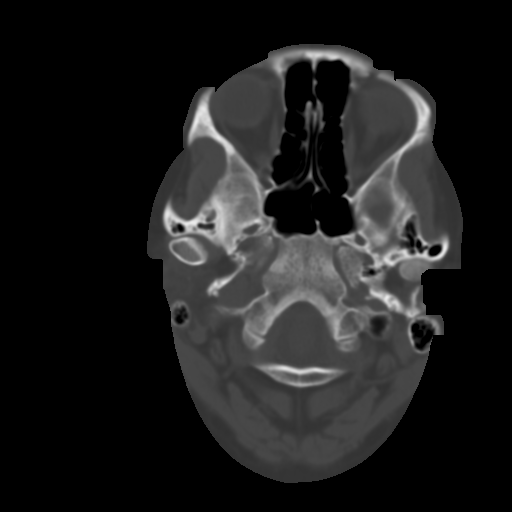
[im 4/30  brain]
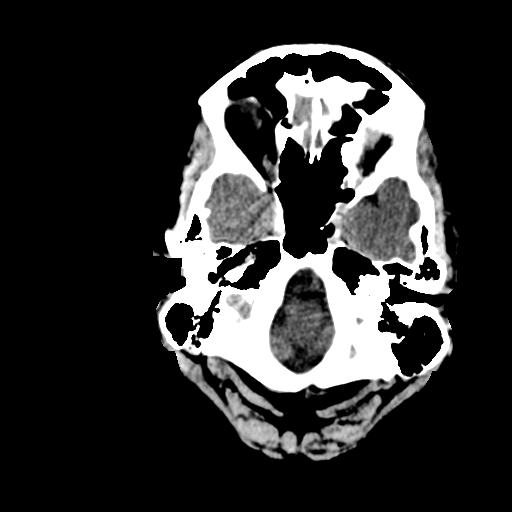
[im 6/30  brain]
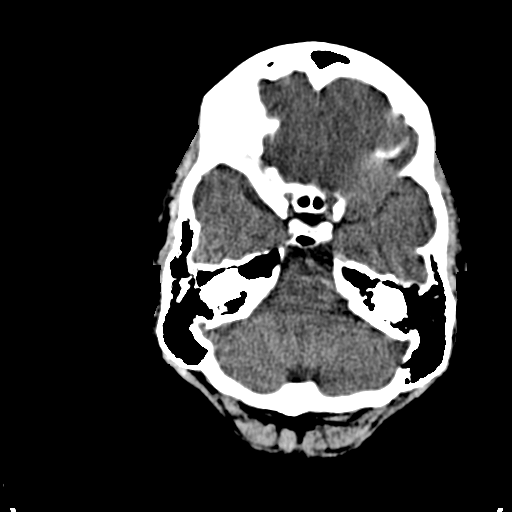
[im 8/30  brain]
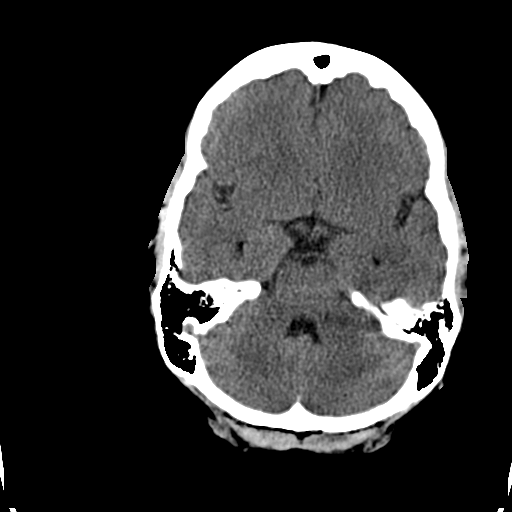
[im 9/30  brain]
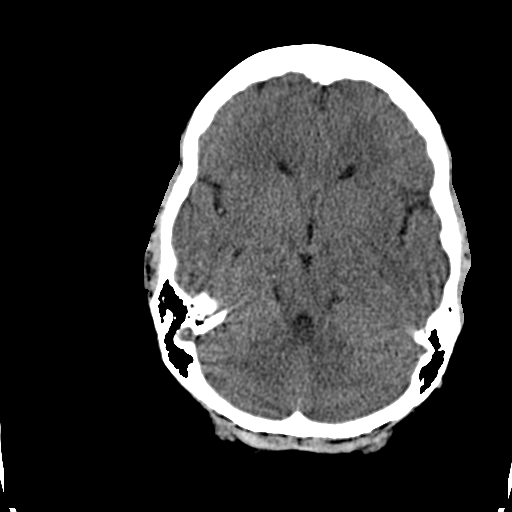
[im 9/30  bone]
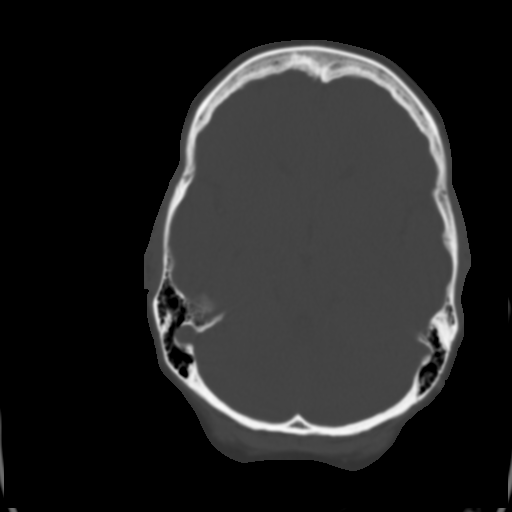
[im 11/30  brain]
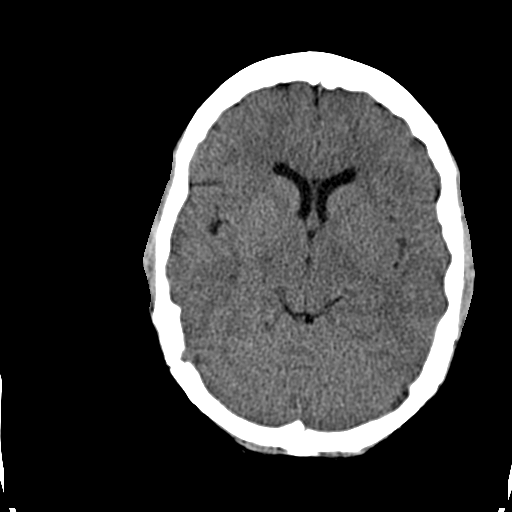
[im 13/30  brain]
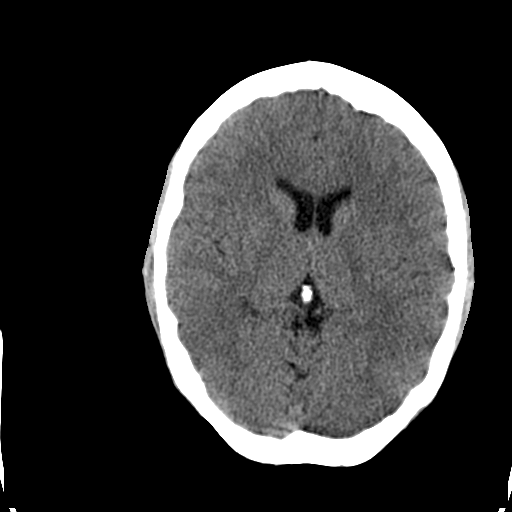
[im 15/30  brain]
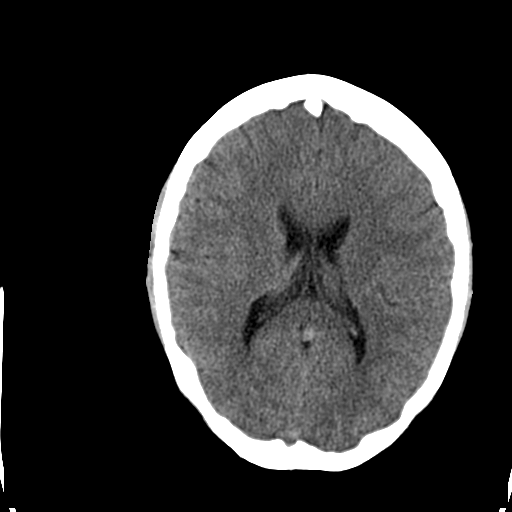
[im 16/30  brain]
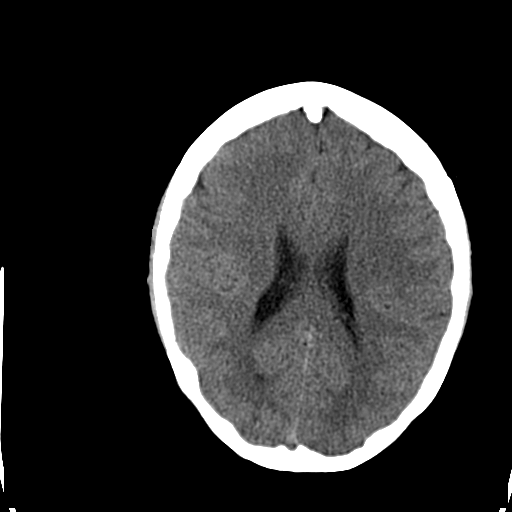
[im 16/30  bone]
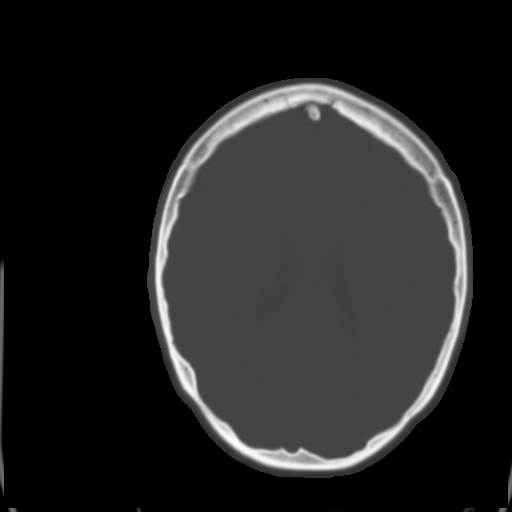
[im 18/30  brain]
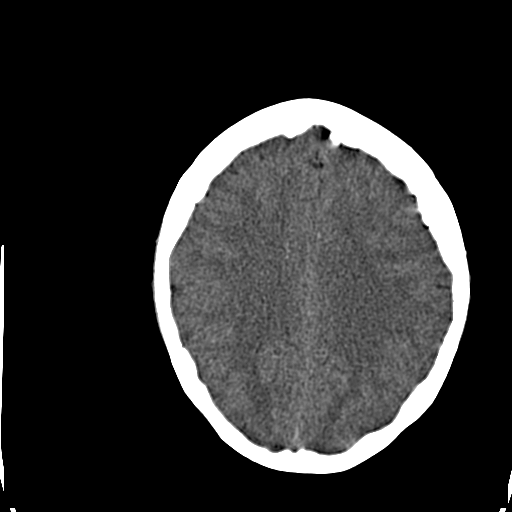
[im 20/30  brain]
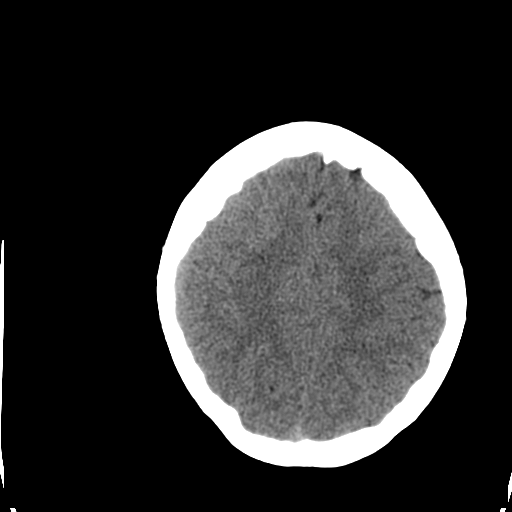
[im 22/30  brain]
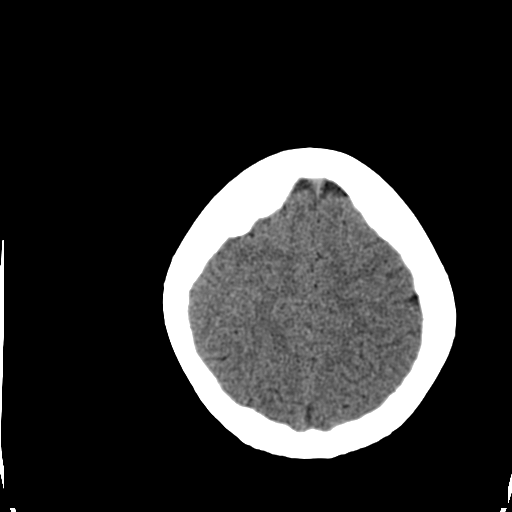
[im 23/30  brain]
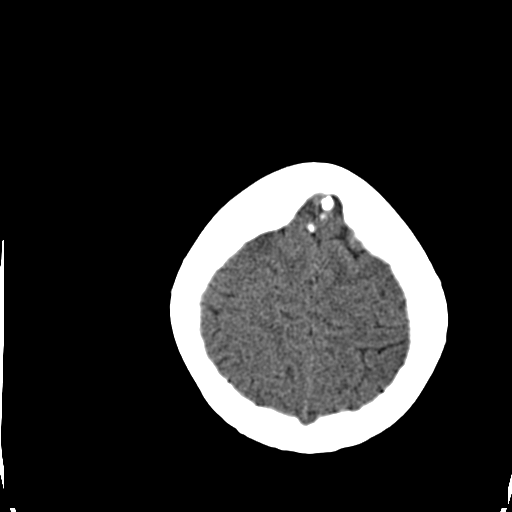
[im 23/30  bone]
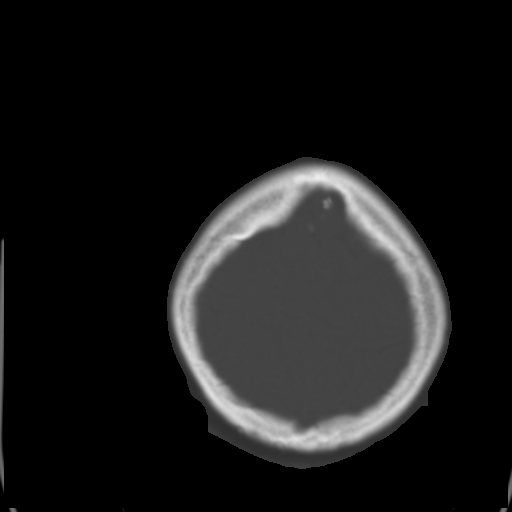
[im 25/30  brain]
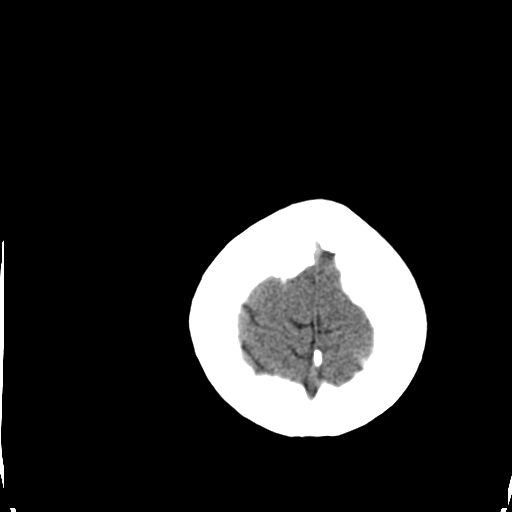
[im 27/30  brain]
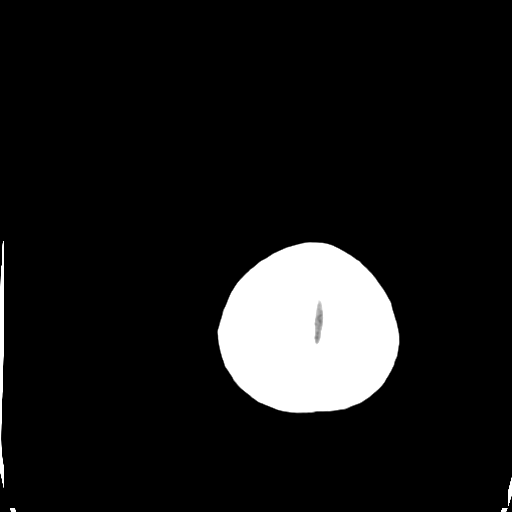
[im 29/30  brain]
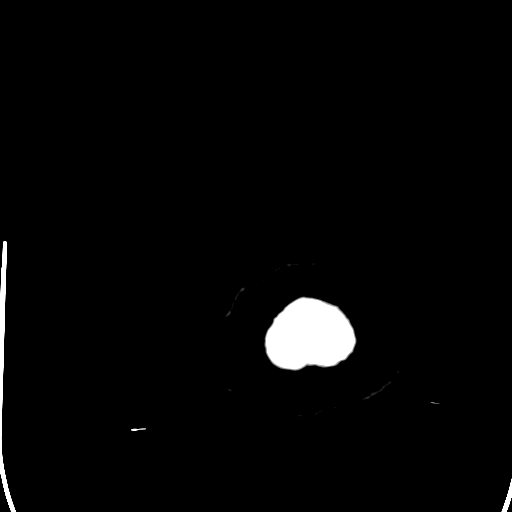

[16 of 30 positions shown; findings below may reference images not displayed]

FINDINGS: Visualized orbits and scalp soft tissues are within
normal limits.  Visualized paranasal sinuses and mastoids are
clear.  No acute osseous abnormality identified.  Incidental dural
calcifications. Cerebral volume is normal.  Ventricular size and
configuration are within normal limits.  No midline shift or mass
effect. Gray-white matter differentiation is within normal limits
throughout the brain.  No evidence of acute cortically based
infarct identified.  No acute intracranial hemorrhage identified.
No suspicious intracranial vascular hyperdensity.
IMPRESSION: No acute intracranial abnormality.

## 2010-04-25 LAB — URINALYSIS, ROUTINE W REFLEX MICROSCOPIC
Bilirubin Urine: NEGATIVE
Glucose, UA: NEGATIVE mg/dL
Hgb urine dipstick: NEGATIVE
Ketones, ur: 15 mg/dL — AB
Leukocytes, UA: NEGATIVE
Nitrite: NEGATIVE
Protein, ur: 30 mg/dL — AB
Specific Gravity, Urine: 1.029 (ref 1.005–1.030)
Urobilinogen, UA: 1 mg/dL (ref 0.0–1.0)
pH: 6.5 (ref 5.0–8.0)

## 2010-04-25 LAB — URINE MICROSCOPIC-ADD ON

## 2010-04-25 LAB — POCT PREGNANCY, URINE: Preg Test, Ur: NEGATIVE

## 2010-04-25 LAB — RAPID STREP SCREEN (MED CTR MEBANE ONLY): Streptococcus, Group A Screen (Direct): NEGATIVE

## 2010-05-24 NOTE — Op Note (Signed)
NAMEARVIS, MIGUEZ  ACCOUNT NO.:  [id number]   MEDICAL RECORD NO.:  [id number]          PATIENT TYPE:  INP   LOCATION:  9371                          [facility]ILITY:  [facility]   PHYSICIAN:  Roseanna Rainbow, M.D.DATE OF BIRTH:  [birth year]   DATE OF PROCEDURE:  06/11/2006  DATE OF DISCHARGE:                               OPERATIVE REPORT   PREOPERATIVE DIAGNOSIS:  Intrauterine pregnancy at term, pregnancy  induced hypertension, arrest of descent, persistent occiput posterior.   POSTOPERATIVE DIAGNOSIS:  Intrauterine pregnancy at term, pregnancy  induced hypertension, arrest of descent, persistent occiput posterior.   PROCEDURE:  Primary low uterine flap elliptical cesarean delivery via  Pfannenstiel skin incision.   SURGEON:  Dr. Tamela Oddi   ANESTHESIA:  Epidural.   ESTIMATED BLOOD LOSS:  800 mL.   COMPLICATIONS:  None.   SPECIMENS:  Placenta to pathology.   PROCEDURE:  The patient was taken to the operating room with a Foley  catheter in place and an epidural catheter in situ.  She was placed in  the dorsal supine position with a leftward tilt and prepped and draped  in the usual sterile fashion.  A Pfannenstiel skin incision was then  made with a scalpel and carried down to the underlying fascia.  The  fascia was nicked in the midline.  The fascial incision was then  extended bilaterally with curved Mayo scissors.  The superior aspect of  the fascial incision was tented up and the underlying rectus muscles  dissected off.  The inferior aspect of the fascial incision was  manipulated in a similar fashion.  The rectus muscles were separated in  the midline.  The parietal peritoneum was tented up and entered sharply  with Metzenbaum scissors.  This incision was then extended superiorly  and inferiorly with good visualization of the bladder.  The Alexza  retractor was then placed into the incision.  The vesicouterine  peritoneum was tented up and entered sharply  with Metzenbaum scissors.  This incision was then extended bilaterally and the bladder flap created  bluntly.  The lower uterine segment was then incised in a transverse  fashion with a scalpel.  The uterine incision was then extended bluntly.  The infant's head was then delivered atraumatically.  The oropharynx was  suctioned with bulb suction.  Cord was clamped and cut.  The infant was  handed over to the awaiting neonatologist.  The placenta was then  removed.  The intrauterine cavity was evacuated of any remaining  amniotic fluid, clots and debris with moist laparotomy sponge.  The  uterine incision was then reapproximated in a running interlocking  fashion using 0 Monocryl.  The second imbricating suture of the same was  then placed.  Adequate hemostasis was noted.  The paracolic gutters were  then irrigated.  The parietal peritoneum was reapproximated in a running  fashion using 2-0 Vicryl.  The fascia was closed in a running fashion  using 0 Vicryl.  The skin was closed with staples.  At the close of the  procedure the instrument and pack counts were said to be correct x2.  A  gram of cephazolin had been given at cord clamp.  The  patient was taken  to the PACU awake and in stable condition.      Roseanna Rainbow, M.D.  Electronically Signed     LAJ/MEDQ  D:  06/11/2006  T:  06/12/2006  Job:  [id number]

## 2010-05-27 NOTE — Group Therapy Note (Signed)
NAME:  ELSE, HABERMANN  ACCOUNT NO.:  [id number]   MEDICAL RECORD NO.:  [id number]          PATIENT TYPE:  WOC   LOCATION:  [facility]                   FACILITY:  [facility]   PHYSICIAN:  Carolanne Grumbling, M.D.   DATE OF BIRTH:  05/02/1975   DATE OF SERVICE:                                    CLINIC NOTE   HISTORY OF PRESENT ILLNESS:  The patient is a 35 year old G0 here for  vaginal discharge, itching and left groin rash.  The patient reports that  she has had increasing occurrence of vaginal infections for over 3 months.  She describes as being very itchy on the inside and having watery discharge.  She also said that she has had some pimples in the groin area in the vulvar  area that are irritated.  She has tried Monistat and miconazole with no  relief of her symptoms both in her groin and in her vagina.  She was  evaluated at [facility] last month and screened for HIV, gonorrhea and  Chlamydia.   PAST MEDICAL HISTORY:  Denies any significant history including diabetes.   PAST SURGICAL HISTORY:  None.   GYNECOLOGIC HISTORY:  G0, last Pap smear was in 2005 and no history of  abnormal.   FAMILY HISTORY:  Mother died of a kidney infection secondary to a  complication from diabetes at age 71.  Father is still living and he has no  diabetes or hypertension.   SOCIAL:  She is single, but has had the same partner for the last 3 years.   PHYSICAL EXAMINATION:  VITAL SIGNS:  Temperature 98.2, blood pressure  138/88, pulse 92, weight 171 and height 5 feet 4 inches.  GENERAL:  Well-developed, well-nourished female in no apparent distress.  HEENT:  Normocephalic, atraumatic.  CARDIOVASCULAR:  Regular rate and rhythm with no murmur appreciated.  LUNGS:  Clear to auscultation bilaterally and good air movement.  ABDOMEN:  Soft, nontender and nondistended.  GENITOURINARY:  Normal external genitalia and no lesions noted.  The vagina  has pink rugae.  Her cervix does appear very  irritated.  I did a Pap smear  and collected a wet prep.  The transitional bone is very inflamed and after  acetic acid was applied, it did turn acetowhite.  Bimanual exam, normal  sized uterus.  No adnexal masses.  She did have some mild diffuse  tenderness.  SKIN:  She has excoriated rash on her right groin that is discolored and has  some flaky edges, its well demarcated and looks consistent with a fungal  infection.   IMPRESSION:  1.  Vaginal discharge.  2.  Rash of the left groin, possible tenia.   PLAN:  A Pap smear, wet prep and skin scraping of the left groin all done  today.  We will wait for those results.  No herpetic lesions appreciated  today.  However, with her story, it is suspicious so we will to readdress  this at the next visit.  Consider screening for diabetes, which I did not  talk about with her today.  May also need to consider screening her for  diabetes.           ______________________________  Carolanne Grumbling, M.D.     TW/MEDQ  D:  02/28/2005  T:  02/28/2005  Job:  [id number]

## 2010-05-27 NOTE — Group Therapy Note (Signed)
NAME:  Kelly Serrano, Kelly Serrano  ACCOUNT NO.:  [id number]   MEDICAL RECORD NO.:  [id number]          PATIENT TYPE:  WOC   LOCATION:  [facility]                   [facility]   PHYSICIAN:  Tinnie Gens, MD        DATE OF BIRTH:  01/03/1976   DATE OF SERVICE:  04/19/2005                                    CLINIC NOTE   DATE:  April 19, 2005   CHIEF COMPLAINT:  Vaginal discharge.   HISTORY OF PRESENT ILLNESS:  The patient is a 35 year old nulligravida who  was last seen in February of this year for recurrent vaginal yeast  infections.  The patient had a HIV screen that was negative in January and  was also evaluated for gonorrhea and chlamydia in January that was negative.  She was seen here and had a excoriated rash on the right groin.  Skin sample  was taken, and it showed no yeast or fungal elements.  On the [facility] report,  she did have yeast on a wet prep as well as fungal elements on her Pap  smear.  The patient had recurrence of symptoms.  She was treated with  Terazol last time, and her symptoms never fully went away and seems to be  back even worse currently.  The patient reports vaginal itching, irritation,  and vaginal discharge.  She is out of [employer].  The patient has not been  screened for diabetes.   PHYSICAL EXAMINATION:  VITAL SIGNS:  As on chart. She is afebrile.  GENERAL:  Well-developed, well-nourished female in no acute distress.  GU: Excoriated rash on the right groin that she describes as starting out as  pimples.  Currently there are several open lesions noted here.  She denies  that her partner has any similar symptoms.  She has normal external female  genitalia.  The vagina is pink and rugae.  The cervix is __________ without  lesion, but her pelvic exam is diffusely tender.  The uterus is small and  anteverted.  She has cervical motion tenderness.  The adnexa are diffusely  tender as well.   IMPRESSION:  Recurrent vaginal yeast infection likely.    PLAN:  1.  Rule out GC and chlamydia given her cervical motion tenderness.  2.  HSV cultures obtained today.  3.  Wet prep sent.  4.  Treat with Diflucan 150 mg 1 p.o.,  repeat in 24 hours, #2 with 2      refills.  5.  Capillary blood glucose today.           ______________________________  Tinnie Gens, MD     TP/MEDQ  D:  04/19/2005  T:  04/19/2005  Job:  [id number]

## 2010-05-27 NOTE — Discharge Summary (Signed)
NAMENAYDELIN, ZIEGLER  ACCOUNT NO.:  [id number]   MEDICAL RECORD NO.:  [id number]          PATIENT TYPE:  INP   LOCATION:  9142                          FACILITY:  [facility]   PHYSICIAN:  Charles A. Clearance Coots, M.D.DATE OF BIRTH:  11-22-1975   DATE OF ADMISSION:  06/10/2006  DATE OF DISCHARGE:  06/14/2006                               DISCHARGE SUMMARY   ADMISSION DIAGNOSIS:  Postdates pregnancy, induction of labor.   DISCHARGE DIAGNOSIS:  Postdates pregnancy, induction of labor.  Status  post primary low transverse cesarean section on June 11, 2006, delivery  of a viable female infant at 2308.  Apgars 8 at 1 minutes 9 at 5  minutes, weight of 3415 grams, length of 51.5 cm with mother and infant  discharged home in good condition.   REASON FOR ADMISSION:  35 year old G1 Hispanic female estimated date of  confinement of Jun 01, 2006 presents for induction of labor for post  dates pregnancy.   PAST MEDICAL HISTORY:  Surgery; none.  Illnesses; none.   MEDICATIONS:  Prenatal vitamins.   ALLERGIES:  NO KNOWN DRUG ALLERGIES   SOCIAL HISTORY:  Single.  Negative tobacco, alcohol or recreational drug  use.   FAMILY HISTORY:  Positive for adult onset diabetes.   GYNECOLOGIC HISTORY:  Negative for sexually transmitted diseases.  Pap  smears within normal limits.   PHYSICAL EXAM:  GENERAL:  Well-nourished, well-developed female in no  acute distress.  VITAL SIGNS:  Afebrile, vital signs were stable.  LUNGS:  Clear to auscultation bilaterally.  HEART: Regular rate and rhythm.  ABDOMEN:  Gravid, nontender.  PELVIC:  Cervix closed, long, vertex at minus two station.   ADMITTING LABORATORY VALUES:  Hemoglobin 12.6, hematocrit 37.4, white  blood cell count 7500, platelets 232,000. Comprehensive metabolic panel  was within normal limits.   HOSPITAL COURSE:  The patient received cervical ripening with Cytotec  and progressed in labor to around the vertex at 0 station and made no  further progress. After that time she was taken to the operating room  for primary low transverse cesarean section for arrest of first stage of  labor and persistent occiput posterior position. Primary low transverse  cesarean section was performed without complications.  Postoperative  course was uncomplicated.  The patient was discharged home on  postoperative day #3 in good condition.   DISCHARGE LABORATORY VALUES:  Hemoglobin 11.3, hematocrit 32, white  blood cell count 14,000, platelets 204,000.   DISCHARGE DISPOSITION:   DISCHARGE MEDICATIONS:  Tylox and ibuprofen were prescribed for pain.  Continue prenatal vitamins.  Routine written instructions were given for  discharge after cesarean section.  The patient is to call office for  follow-up appointment in 2 weeks.      Charles A. Clearance Coots, M.D.  Electronically Signed     CAH/MEDQ  D:  08/02/2006  T:  08/03/2006  Job:  [id number]

## 2010-05-27 NOTE — Consult Note (Signed)
NAMEABELLA, Serrano  ACCOUNT NO.:  [id number]   MEDICAL RECORD NO.:  [id number]          PATIENT TYPE:  EMS   LOCATION:  MAJO                         FACILITY:  [facility]   PHYSICIAN:  Leonie Man, M.D.   DATE OF BIRTH:  Jun 21, 1975   DATE OF CONSULTATION:  02/08/2006  DATE OF DISCHARGE:                                 CONSULTATION   PRIMARY CARE PHYSICIAN/OBSTETRICIAN:  Roseanna Rainbow, M.D.   CHIEF COMPLAINT:  Abdominal pain and nausea and vomiting.   HISTORY OF PRESENT ILLNESS:  Kelly Serrano is a 35 year old  pregnant female who is due June 13, 2006.  She developed acute upper  abdominal pain with nausea, vomiting last p.m.  She states she has  vomited x3.  She presented to the ER today because of continued pain.  Her LFTs are normal.  She is afebrile, but she had a mildly elevated  white count and neutrophil count.  An ultrasound was performed, somewhat  limited due to the having of gravid abdomen, but there is a  demonstration of a stone in the gallbladder neck region.  Because the  patient is still in pain, surgical evaluation has been requested.   PAST HISTORY:  None.   PAST SURGICAL HISTORY:  None.   FAMILY HISTORY:  Noncontributory.   SOCIAL HISTORY:  No tobacco or alcohol.  She is married.  This is her  first baby.   ALLERGIES:  NKDA.   CURRENT MEDICATIONS:  Listed as none.  I think the patient is on  prenatal vitamins.   REVIEW OF SYSTEMS:  The patient last ate something around 7:00 p.m.  yesterday, did okay with that.  She has had no diarrhea.  No fever and  dysuria.  About 3 weeks ago she had similar symptoms that spontaneously  resolved.  She states she is still feeling fetal movements, and she is  still in abdominal pain despite receiving IV morphine in the ER.   PHYSICAL EXAMINATION:  GENERAL:  Pleasant female patient who looks  slightly acutely ill complaining of continued abdominal pain.  VITAL SIGNS:  Temperature 96.9, BP  148/81, pulse 88, respirations 20.  NEURO:  The patient is alert when x3 moving all extremities x4.  No  focal deficits.  HEENT:  Head normocephalic.  Cheeks are flushed.  Sclerae noninjected,  nonicteric.  NECK:  Supple without appreciable adenopathy.  CHEST:  Bilateral lung sounds are clear to auscultation, slightly  diminished in the bases.  Respiratory effort is nonlabored.  CARDIAC:  S1, S2, no rubs, murmurs of gallops.  IV fluids infusing at 50  an hour.  ABDOMEN:  Large and gravid.  Fundus is above the umbilicus.  She is  tender in the epigastric and bilateral upper quadrant regions.  Bowel  sounds are present.  No guarding or rebounding.  GU:  Deferred.  BREAST:  Deferred.  EXTREMITIES:  Symmetrical in appearance without edema, cyanosis or  clubbing.   DIAGNOSTICS AND LABORATORY VALUES:  Ultrasound again demonstrates stone  in the neck the gallbladder, no other unusual findings called by the  radiologist.  Urinalysis shows white count of 3-6, bacteria few.  WBCs  12,600, hemoglobin 13.4, platelets 332,000,  neutrophils 87%.  Sodium  135, potassium 3.8, CO2 25, glucose 128, BUN 2, creatinine 0.45.  LFTs  are normal.  Lipase is normal.   IMPRESSION:  1. Abdominal pain secondary to biliary colic.  2. Cholelithiasis.  3. Leukocytosis.  4. Nausea and vomiting.  5. Gravid female 6 months pregnant.   PLAN OF ACTION:  1. Dr. Lurene Shadow has assisted with obtaining the history and physical.      He does speak some Spanish and he also had a translator come in.      Based on these findings, we will proceed as follows.  Dr. Lurene Shadow      has discussed with Dr. Jean Rosenthal, and we will go ahead and transfer      the patient to [facility] for continued observation, giving      IV fluids, empiric Ancef, IV pain medications and antiemetics.  2. Dr. Lurene Shadow was also discussed with Dr. Ezzard Standing that the patient will      be transferred to [facility] and he will also assist [place] in evaluating       the patient after she has been transferred to [facility].  If      the patient's symptoms have not improved over the next 6-8 hours,      she may need to undergo a cholecystectomy.  This will be based on a      surgical evaluation later today or early tomorrow morning.      Allison L. Rennis Harding, N.P.      Leonie Man, M.D.  Electronically Signed    ALE/MEDQ  D:  02/08/2006  T:  02/08/2006  Job:  [id number]

## 2010-06-23 ENCOUNTER — Ambulatory Visit: Payer: Self-pay

## 2010-06-24 ENCOUNTER — Ambulatory Visit: Payer: Self-pay | Admitting: Family Medicine

## 2010-07-05 ENCOUNTER — Ambulatory Visit (INDEPENDENT_AMBULATORY_CARE_PROVIDER_SITE_OTHER): Payer: Self-pay | Admitting: Family Medicine

## 2010-07-05 DIAGNOSIS — M62838 Other muscle spasm: Secondary | ICD-10-CM

## 2010-07-05 MED ORDER — IBUPROFEN 800 MG PO TABS: 800.0000 mg | ORAL_TABLET | Freq: Three times a day (TID) | ORAL | Status: AC | PRN

## 2010-07-05 NOTE — Progress Notes (Signed)
  Subjective:    Patient ID: Kelly Serrano, female    DOB: 1975-06-02, 35 y.o.   MRN: [id number]  HPI 1. Buttocks pain Patient had her infant jump on her back left thigh one month ago. She has since had left thigh and buttocks pain with radiation down her left leg and occasional position paresthesia of the left leg. She has no back pain. No bladder/bowel/sensory/sexual symptoms. She has no fevers or arthralgias. The patient says her symptoms are improving. She has not tried any medications.   Review of Systems See hpi    Objective:   Physical Exam  Musculoskeletal: Normal range of motion.       Lumbar back: She exhibits normal range of motion, no tenderness, no bony tenderness, no swelling, no edema, no deformity, no pain and no spasm.       Right upper leg: She exhibits tenderness. She exhibits no swelling, no edema and no deformity.       Right buttocks muscle spasm/point tenderness  Neurological: She has normal reflexes.       Assessment & Plan:  1. Right Gluteal Muscle Spasm - Ibuprofen 800 mg q 8 hours PO PRN pain, with food. Advised of stomach irritation.

## 2010-09-19 ENCOUNTER — Encounter: Payer: Self-pay | Admitting: Family Medicine

## 2010-09-19 ENCOUNTER — Ambulatory Visit (INDEPENDENT_AMBULATORY_CARE_PROVIDER_SITE_OTHER): Payer: Self-pay | Admitting: Family Medicine

## 2010-09-19 VITALS — BP 130/80 | HR 98 | Wt 175.3 lb

## 2010-09-19 DIAGNOSIS — G8929 Other chronic pain: Secondary | ICD-10-CM | POA: Insufficient documentation

## 2010-09-19 DIAGNOSIS — M533 Sacrococcygeal disorders, not elsewhere classified: Secondary | ICD-10-CM

## 2010-09-19 HISTORY — DX: Other chronic pain: G89.29

## 2010-09-19 NOTE — Assessment & Plan Note (Signed)
Pain localizes to the SI joint.  She does not have any red flag signs or symptoms today that would indicate more serious etiology.  Plan: PT however pt will not be able to afford to go. I showed the patient pretzel stretches, hamstring stretches, step ups, lateral step ups and cross over step ups.  We discussed a simple home PT plan.  Pt will work on getting the orange card and will f/u with PCP in 1 month. If she has the card and continues to have pain we will refer to PT.

## 2010-09-19 NOTE — Progress Notes (Signed)
Leg Pain: Child jumped on her back 2 months ago. She has had pain in left lower back that radiates to left knee since. She was seen by Dr. Rivka Safer initially and is here for a follow up visit today. She continues to have 8/10 pain today especially with standing. She denies any numbness or weakness or bowel or bladder dysfunction. She does not have any insurance or the [employer] so has not been to PT yet. She takes ibuprofen which helps with the pain. She feels well otherwise.   PMH reviewed.  ROS as above otherwise neg  Exam:  BP 130/80  Pulse 98  Wt 175 lb 4.8 oz (79.516 kg)  LMP 09/03/2010 Gen: Well NAD MSK: Normal stance and gait. Able to stand and get on exam table by herself. TTP over left SI joint. Neg straight leg or contralateral straight leg raise test. Neg FABER test.  Positive pretzel stretch test on left SI joint.

## 2010-10-27 LAB — COMPREHENSIVE METABOLIC PANEL
ALT: 22
ALT: 24
AST: 25
AST: 30
Albumin: 1.8 — ABNORMAL LOW
Albumin: 2.5 — ABNORMAL LOW
Alkaline Phosphatase: 134 — ABNORMAL HIGH
Alkaline Phosphatase: 165 — ABNORMAL HIGH
BUN: 7
BUN: 9
CO2: 20
CO2: 26
Calcium: 7.9 — ABNORMAL LOW
Calcium: 8.7
Chloride: 108
Chloride: 109
Creatinine, Ser: 0.52
Creatinine, Ser: 0.75
GFR calc Af Amer: >60
GFR calc Af Amer: >60
GFR calc non Af Amer: >60
GFR calc non Af Amer: >60
Glucose, Bld: 114 — ABNORMAL HIGH
Glucose, Bld: 96
Potassium: 3.9
Potassium: 4.1
Sodium: 135
Sodium: 139
Total Bilirubin: 0.3
Total Bilirubin: 0.4
Total Protein: 4.5 — ABNORMAL LOW
Total Protein: 6.1

## 2010-10-27 LAB — LACTATE DEHYDROGENASE
LDH: 125
LDH: 206

## 2010-10-27 LAB — CBC
HCT: 32.1 — ABNORMAL LOW
HCT: 37.4
HCT: 37.4
Hemoglobin: 11.3 — ABNORMAL LOW
Hemoglobin: 12.6
Hemoglobin: 12.7
MCHC: 33.6
MCHC: 34
MCHC: 35.2
MCV: 87.6
MCV: 88.9
MCV: 89
Platelets: 204
Platelets: 218
Platelets: 232
RBC: 3.67 — ABNORMAL LOW
RBC: 4.2
RBC: 4.21
RDW: 12.7
RDW: 13.2
RDW: 13.3
WBC: 14.2 — ABNORMAL HIGH
WBC: 7.5
WBC: 7.6

## 2010-10-27 LAB — DIFFERENTIAL
Basophils Absolute: 0
Basophils Relative: 0
Eosinophils Absolute: 0.1
Eosinophils Relative: 1
Lymphocytes Relative: 24
Lymphs Abs: 1.8
Monocytes Absolute: 0.5
Monocytes Relative: 7
Neutro Abs: 5.1
Neutrophils Relative %: 68

## 2010-10-27 LAB — TYPE AND SCREEN
ABO/RH(D): O POS
Antibody Screen: NEGATIVE

## 2010-10-27 LAB — URIC ACID
Uric Acid, Serum: 4.1
Uric Acid, Serum: 5.7

## 2010-10-27 LAB — ABO/RH: ABO/RH(D): O POS

## 2010-10-27 LAB — MAGNESIUM: Magnesium: 5.5 — ABNORMAL HIGH

## 2010-10-27 LAB — RUBELLA SCREEN: Rubella: 38.6 — ABNORMAL HIGH

## 2010-10-27 LAB — HEPATITIS B SURFACE ANTIGEN: Hepatitis B Surface Ag: NEGATIVE

## 2010-10-27 LAB — RPR
RPR Ser Ql: NONREACTIVE
RPR Ser Ql: NONREACTIVE

## 2011-01-19 ENCOUNTER — Emergency Department (HOSPITAL_COMMUNITY)
Admission: EM | Admit: 2011-01-19 | Discharge: 2011-01-19 | Disposition: A | Discharge status: Home / Self Care | Payer: Self-pay | Attending: Emergency Medicine | Admitting: Emergency Medicine

## 2011-01-19 ENCOUNTER — Encounter (HOSPITAL_COMMUNITY): Payer: Self-pay

## 2011-01-19 DIAGNOSIS — M545 Low back pain, unspecified: Secondary | ICD-10-CM | POA: Insufficient documentation

## 2011-01-19 DIAGNOSIS — M5431 Sciatica, right side: Secondary | ICD-10-CM

## 2011-01-19 DIAGNOSIS — M79609 Pain in unspecified limb: Secondary | ICD-10-CM | POA: Insufficient documentation

## 2011-01-19 DIAGNOSIS — M533 Sacrococcygeal disorders, not elsewhere classified: Secondary | ICD-10-CM | POA: Insufficient documentation

## 2011-01-19 DIAGNOSIS — M5430 Sciatica, unspecified side: Secondary | ICD-10-CM | POA: Insufficient documentation

## 2011-01-19 MED ORDER — ORPHENADRINE CITRATE ER 100 MG PO TB12: 100.0000 mg | ORAL_TABLET | Freq: Two times a day (BID) | ORAL | Status: DC

## 2011-01-19 MED ORDER — OXYCODONE-ACETAMINOPHEN 5-325 MG PO TABS: 1.0000 | ORAL_TABLET | ORAL | Status: DC | PRN

## 2011-01-19 MED ORDER — PREDNISONE 20 MG PO TABS
60.0000 mg | ORAL_TABLET | Freq: Once | ORAL | Status: AC
  Administered 2011-01-19: 60 mg via ORAL
  Filled 2011-01-19: qty 3

## 2011-01-19 MED ORDER — CYCLOBENZAPRINE HCL 10 MG PO TABS
10.0000 mg | ORAL_TABLET | Freq: Once | ORAL | Status: AC
  Administered 2011-01-19: 10 mg via ORAL
  Filled 2011-01-19: qty 1

## 2011-01-19 MED ORDER — NAPROXEN 500 MG PO TABS: 500.0000 mg | ORAL_TABLET | Freq: Two times a day (BID) | ORAL | Status: DC

## 2011-01-19 MED ORDER — PREDNISONE 50 MG PO TABS: 50.0000 mg | ORAL_TABLET | Freq: Every day | ORAL | Status: DC

## 2011-01-19 MED ORDER — IBUPROFEN 800 MG PO TABS
800.0000 mg | ORAL_TABLET | Freq: Once | ORAL | Status: AC
  Administered 2011-01-19: 800 mg via ORAL
  Filled 2011-01-19: qty 1

## 2011-01-19 MED ORDER — OXYCODONE-ACETAMINOPHEN 5-325 MG PO TABS
1.0000 | ORAL_TABLET | Freq: Once | ORAL | Status: AC
  Administered 2011-01-19: 1 via ORAL
  Filled 2011-01-19: qty 1

## 2011-01-19 NOTE — ED Provider Notes (Signed)
History     CSN: [id number]  Arrival date & time 01/19/11  4098   First MD Initiated Contact with Patient 01/19/11 2019      Chief Complaint  Patient presents with  . Tailbone Pain  . Leg Pain    (Consider location/radiation/quality/duration/timing/severity/associated sxs/prior treatment) HPI 36 year old female comes in with a one-week history of pain in her right buttock radiating down her leg and also up into her back. Pain is getting worse. It is worse when she tries to stand up and worse when she lays in certain positions. She denies any weakness or numbness or tingling. Denies any bowel or bladder incontinence. She denies any trauma, although she does have to do a lot of bending and. She denies previous episodes like this. Pain is severe and she rates it at 8/10., Both give her mild, temporary relief. History reviewed. No pertinent past medical history.  Past Surgical History  Procedure Date  . Cesarean section     History reviewed. No pertinent family history.  History  Substance Use Topics  . Smoking status: Never Smoker   . Smokeless tobacco: Not on file  . Alcohol Use: No    OB History    Grav Para Term Preterm Abortions TAB SAB Ect Mult Living                  Review of Systems  Allergies  Review of patient's allergies indicates no known allergies.  Home Medications   Current Outpatient Rx  Name Route Sig Dispense Refill  . ACETAMINOPHEN 325 MG PO TABS Oral Take 650 mg by mouth every 6 (six) hours as needed. For pain/fever    . NAPROXEN 500 MG PO TABS Oral Take 1 tablet (500 mg total) by mouth 2 (two) times daily. 30 tablet 0  . ORPHENADRINE CITRATE ER 100 MG PO TB12 Oral Take 1 tablet (100 mg total) by mouth 2 (two) times daily. 30 tablet 0  . OXYCODONE-ACETAMINOPHEN 5-325 MG PO TABS Oral Take 1 tablet by mouth every 4 (four) hours as needed for pain. 20 tablet 0  . PREDNISONE 50 MG PO TABS Oral Take 1 tablet (50 mg total) by mouth daily. 15 tablet 0     BP 121/82  Pulse 86  Temp(Src) 99.5 F (37.5 C) (Oral)  Resp 16  SpO2 99%  LMP 01/03/2011  Physical Exam 36 year old female is resting comfortably and in no acute distress. Vital signs are normal. Oxygen saturation is 99% which is normal. Head is normocephalic and atraumatic. PERRLA, EOMI. Neck is supple and nontender without adenopathy or JVD. Back has no midline tenderness but there is moderate tenderness in the right paralumbar area with mild spasm. Straight leg raise is positive on the right at 15 and a crossed straight leg raise is positive on the left at 30. Heart has a regular rate and rhythm without murmur. Lungs are clear without rales, wheezes, rhonchi. Abdomen is soft, flat, nontender without masses or hepatosplenomegaly. Extremities have no cyanosis or edema, full range of motion present. Skin is warm and moist without rash. Neurologic: Mental status is normal, cranial nerves are intact, there no focal motor or sensory deficits. Psychiatric: No abnormalities of mood or affect. ED Course  Procedures (including critical care time)  Labs Reviewed - No data to display No results found.   1. Sciatica of right side       MDM  Right sciatica. She will be treated with oral NSAIDs, muscle relaxers, narcotics, and a short course  of steroids.        Dione Booze, MD 01/19/11 2043

## 2011-01-19 NOTE — ED Notes (Signed)
Pt d/c home. Pt states she has a ride and will not be driving. Pt in NAD at this time. D/c instructions given and pt voiced understanding

## 2011-01-19 NOTE — ED Notes (Signed)
Pt states that for the past few days she has been having right buttock pain that feels like it is going down her right thigh. She denies any trauma to her back. She states that the pain is getting worse. Tylenol last taken this am with no relief. Pt denies any past dx of sciatica.

## 2011-01-19 NOTE — Discharge Instructions (Signed)
Citica (Sciatica) La citica consiste en la debilidad y/o cambios en la sensibilidad (hormigueo, sacudidas, calor y fro, adormecimiento) a lo largo del recorrido del nervio citico. La irritacin o el dao de las races nerviosas lumbares generalmente se denominan radiculopata lumbar.  La radiculopata lumbar (citica) es la forma ms frecuente de este problema, pero una radiculopata puede ocurrir en cualquiera de los nervios que salen de la mdula espinal. Los problemas que causa dependen de qu nervios estn involucrados. El nervio citico es un gran nervio que suministra ramificaciones nerviosas que se dirigen a las extremidades inferiores (desde la cadera a los pies). La citica generalmente causa adormecimiento o debilidad en la piel y/o en los msculos que inerva el nervio citico. Tambin puede causar sntomas (problemas) como dolor, [place], hormigueo, sensacin de electricidad en el recorrido del nervio. Habitualmente se debe a una lesin de las fibras que forman el nervio citico. Algunos de estos sntomas son dolor de cintura y/o sensaciones desagradables en las siguiente reas:  Desde la zona media de la nalga hacia abajo, en la parte posterior de la pierna y hasta la rodilla.   Desde la salida de la pantorrilla hasta la parte superior del pie.   Detrs de Hydrographic surveyor interna del tobillo hasta la planta del pie.  CAUSAS   Disco herniado o desplazado. Los discos son pequeas almohadillas (porciones de disco) que se encuentran entre las vrtebras.   Presin ejercida por los [employer] piriformes de las nalgas sobre el nervio citico. (Sndrome piriforme).   Mala alineacin de los huesos de la cintura y las nalgas (trastorno de la Dispensing optician).   Estrechamiento del canal espinal que ejerce presin o pellizca las fibras que forman el nervio citico.   Desplazamiento de [place], de modo que sale de la lnea en la que se encuentran las dems vrtebras.   Anormalidad del  sistema nervioso de modo que las fibras nerviosas no transmiten las seales correctamente, especialmente a los pies y las pantorrillas (neuropata).   Tumor (no es frecuente).  El profesional puede determinar la causa de la citica y comenzar el tratamiento que tenga ms probabilidad de ser efectivo para usted. TRATAMIENTO Puede tomar medicamentos para Engineer, materials de venta [place], puede usar fisioterapia, reposo, ejercicios, masajes en la columna e inyecciones de anestsicos, corticoides o toxinas botulnicas. Tambin pueden ser [employer], la acupuntura y la [place]. Las tcnicas de Microbiologist u otras tcnicas de sugestin y la modificacin de ciertos factores como la cama, la silla, la altura del escritorio, la postura y [place] actividades son otros tratamientos que pueden ser de [place]. El profesional que lo asiste, junto con sus aportes, ayudarn a Chief Strategy Officer qu es lo mejor para usted. Con un correcto diagnstico (determinar cul es el problema), la causa de la mayor parte de los casos de citica podrn identificarse y solucionarse. La comunicacin y la cooperacin entre usted y el profesional que lo asiste es fundamental. Si no obtiene xito inmediatamente, no Engineer, materials, ya que con Museum/gallery conservator podr Contractor tratamiento adecuado que lo har mejorar. INSTRUCCIONES PARA EL CUIDADO DOMICILIARIO  Si el dolor proviene de un problema en la espalda, aplicar hielo en la zona durante 15 a 20 minutos, 3 a 4 veces por da, mientras se encuentre despierto, podr serle beneficioso. Ponga el hielo en una bolsa plstica y coloque una toalla entre la bolsa y la piel.   Puede practicar ejercicios o realizar las actividades habituales si no Visual merchandiser, o hgalo segn las  indicaciones del profesional que lo asiste.   Utilice los medicamentos de venta libre o de prescripcin para Chief Technology Officer, Environmental health practitioner o la [place], segn se lo indique el profesional que lo asiste.   Si el profesional que  lo [employer] pide que concurra a una cita de seguimiento, es importante asistir a ella. No concurrir a la [place] representative como consecuencia una lesin crnica o [place], dolor, e incapacidad. Si tiene algn problema para asistir a la cita, debe comunicarse con el establecimiento para obtener asistencia.  SOLICITE ATENCIN MDICA DE INMEDIATO SI:  Pierde el control de la vejiga o del intestino.   Aumenta la debilidad en el tronco, las nalgas o las piernas.   Siente que se adormece cualquier zona desde la cadera [employer] dedos de los pies.   Tiene dificultad para caminar o mantener el equilibrio.   Y si presenta los sntomas mencionados, junto con fiebre o vmitos intensos.  Document Released: 12/26/2004 Document Revised: 09/07/2010 [facility] Patient Information 2012 [place], [place].  Prednisone tablets Qu es este medicamento? La PREDNISONA es un corticosteroide. Se utiliza para tratar la inflamacin de la piel, articulaciones, pulmones y otros rganos. Condiciones comunes tratadas incluyen asma, alergias y artritis. Tambin se [place] para [employer], tales como trastornos sanguneos o enfermedades de la glndula suprarrenal. Este medicamento puede ser utilizado para otros usos; si tiene alguna pregunta consulte con su proveedor de atencin mdica o con su farmacutico. Qu le debo informar a mi profesional de la salud antes de tomar este medicamento? Necesita saber si usted presenta alguno de los siguientes problemas o situaciones: -Sndrome de [place] -diabetes -glaucoma -problemas cardiacos o enfermedad cardiaca -alta presin sangunea -infecciones, tales como herpes, sarampin, tuberculosis o varicela -enfermedad renal -enfermedad heptica -problemas mentales -miastenia gravis -osteoporosis -convulsiones -enfermedad estomacal, intestinal o de la lcera, incluyendo colitis y diverticulitis -problemas tiroideos -una reaccin alrgica o inusual a la lactosa, a la  prednisona, a otros medicamentos, alimentos, colorantes o conservantes -si est embarazada o buscando quedar embarazada -si est amamantando a un beb Cmo debo utilizar este medicamento? Tome este medicamento por va oral con un vaso de agua. Siga las instrucciones de la etiqueta del [place]. Tmelo con alimentos o con leche para Engineer, structural. Si slo toma este medicamento una vez al da, tmelo por la [place]. No tome ms medicamento que lo indicado. No deje de tomar este medicamento de manera abrupta debido a que podr Copywriter, advertising reaccin severa. Su mdico le indicar la cantidad de Agricultural consultant. Si su mdico desea que [employer], la dosis ser reducida gradualmente para Psychiatric nurse secundarios. Hable con su pediatra para informarse acerca del uso de este medicamento en nios. Puede requerir atencin especial. Sobredosis: Pngase en contacto inmediatamente con un centro toxicolgico o una sala de urgencia si usted cree que haya tomado demasiado medicamento. ATENCIN: [employer] es solo para usted. No comparta este medicamento con nadie. Qu sucede si me olvido de una dosis? Si olvida una dosis, tmela tan pronto como sea posible. Si es casi la hora de su dosis siguiente, consulte a su mdico o a su profesional de Radiographer, therapeutic. Usted puede necesitar saltar una dosis o tomar una dosis adicional. No tome dosis dobles o adicionales sin asesoramiento. Qu puede interactuar con este medicamento? No tome esta medicina con ninguno de los siguientes medicamentos: -mifepristona Esta medicina tambin puede interactuar con los siguientes medicamentos: -aspirina o medicamentos tipo aspirina -vacunas Puede ser que esta lista no menciona  todas las posibles interacciones. Informe a su profesional de [employer] de [employer] productos a base de hierbas, medicamentos de [place] o suplementos nutritivos que est tomando. Si usted fuma, consume  bebidas alcohlicas o si utiliza drogas ilegales, indqueselo tambin a su profesional de [employer]. Algunas sustancias pueden interactuar con su medicamento. A qu debo estar atento al usar [employer]? Visite a su mdico o a su profesional de la salud para chequear su evolucin peridicamente. Si toma este medicamento durante un perodo prolongado, lleve consigo una tarjeta de identificacin con su nombre y direccin, el tipo y la dosis del [place], y Administrator, Civil Service y la direccin de su mdico. [place] medicamento puede aumentar su riesgo de contraer una infeccin. Trate de no acercarse a personas que estn enfermas. Informe a su mdico o a su profesional de la salud si est en contacto con personas con sarampin o varicela. Si va a someterse a una operacin, informe a su mdico o a su profesional de la salud que ha tomado este [employer] ltimos doce meses. Consulte a su mdico o a su profesional de la salud acerca de su dieta. Tal vez tendr que reducir la cantidad de sal que consume. Este medicamento puede aumentar su nivel de Banker. Si es diabtico, consulte a su mdico si necesita ayuda para ajustar la dosis de su medicamento para la diabetes. Qu efectos secundarios puedo tener al [employer] este medicamento? Efectos secundarios que debe informar a su mdico o a Producer, television/film/video de la salud tan pronto como sea posible: -dolor ocular, visin borrosa o disminuida u ojos hinchados -fiebre, dolor de garganta, estornudos, tos u otros signos de infeccin, heridas que no cicatrizan -frecuentes descargas de orina -aumento de la sed -depresin mental, cambios de humor, sentimientos equivocados de [place], sentimientos de ser [address] [employer] caderas, espalda, costillas, brazos, hombros o piernas -hinchazn de los pies o de la parte inferior de las piernas Efectos secundarios que, por lo general, no requieren Psychologist, prison and probation services (debe informarlos a su mdico o a su  profesional de la salud si persisten o si son molestos): -confusin, excitacin o inquietud -dolor de cabeza -nuseas, vmito -problemas en la piel, acn, piel delgada y brillante -aumento de peso Puede ser que esta lista no menciona todos los posibles efectos secundarios. Comunquese a su mdico por asesoramiento mdico [employer]. Usted puede informar los efectos secundarios a la FDA por telfono al 1-800-FDA-1088. Dnde debo guardar mi medicina? Mantngala fuera del alcance de los nios. Gurdela a [employer], entre 15 y 30 grados C (46 y 56 grados F). Protejla de la luz. Mantenga el envase bien cerrado. Deseche los medicamentos que no haya utilizado, despus de la fecha de vencimiento. ATENCIN: Este folleto es un resumen. Puede ser que no cubra toda la posible informacin. Si usted tiene preguntas acerca de esta medicina, consulte con su mdico, su farmacutico o su profesional de Radiographer, therapeutic.  2012, Elsevier/Gold Standard. (05/03/2005 5:06:00 PM)  Orphenadrine tablets Qu es este medicamento? La ORFENADRINA ayuda a Engineer, materials y la rigidez muscular y sirve para el tratamiento de los espasmos musculares. Este medicamento puede ser utilizado para otros usos; si tiene alguna pregunta consulte con su proveedor de atencin mdica o con su farmacutico. Qu le debo informar a mi profesional de la salud antes de tomar este medicamento? Necesita saber si usted presenta alguno de los siguientes problemas o situaciones: -glaucoma -enfermedad cardiaca -enfermedad renal -miastenia gravis -lcera pptica -  enfermedad de la prstata -problemas de estmago -una reaccin alrgica o inusual a la orfenadrina, a otros medicamentos, alimentos, lactosa, colorantes o conservantes -si est embarazada o buscando quedar embarazada -si est amamantando a un beb Cmo debo utilizar este medicamento? Tome este medicamento por va oral con un vaso lleno de agua. Siga las  instrucciones de la etiqueta del [place]. Tome sus dosis a intervalos regulares. No tome su medicamento con una frecuencia mayor a la indicada. No tome ms de lo indicado. Hable con su pediatra para informarse acerca del uso de este medicamento en nios. Puede requerir Customer service manager. Los pacientes de ms de 65 aos de edad pueden presentar reacciones ms fuertes y Pension scheme manager dosis [employer]. Sobredosis: Pngase en contacto inmediatamente con un centro toxicolgico o una sala de urgencia si usted cree que haya tomado demasiado medicamento. ATENCIN: [employer] es solo para usted. No comparta este medicamento con nadie. Qu sucede si me olvido de una dosis? Si olvida una dosis, tmela lo antes posible. Si es casi la hora de la prxima dosis, tome slo esa dosis. No tome dosis adicionales o dobles. Qu puede interactuar con este medicamento? -alcohol -antihistamnicos -barbitricos, tales como el fenobarbital -benzodiacepinas -ciclobenzaprina -analgsicos -fenotiazinas, tales como clorpromacina, mesoridazina, proclorperazina, tioridazina Puede ser que esta lista no menciona todas las posibles interacciones. Informe a su profesional de [employer] de [employer] productos a base de hierbas, medicamentos de [place] o suplementos nutritivos que est tomando. Si usted fuma, consume bebidas alcohlicas o si utiliza drogas ilegales, indqueselo tambin a su profesional de [employer]. Algunas sustancias pueden interactuar con su medicamento. A qu debo estar atento al usar [employer]? Se le podr secar la boca. Masticar chicle sin azcar, chupar caramelos duros y tomar agua en abundancia le ayudar a mantener la boca hmeda. Si el problema no desaparece o es severo, consulte a su mdico. Este medicamento puede resecarle los ojos y provocar visin borrosa. Si [place] lentes de contacto, puede sentir ciertas molestias. Las gotas lubricantes pueden ser tiles. Si el problema no desaparece o es  severo, consulte con su mdico de los ojos. Puede experimentar mareos o somnolencia. No conduzca ni utilice maquinaria ni haga nada que Scientist, research (life sciences) en estado de alerta hasta que sepa cmo le afecta este medicamento. No se siente ni se ponga de pie con rapidez, especialmente si es un paciente de edad avanzada. Esto reduce el riesgo de mareos o [employer]. El alcohol puede interferir con el efecto de [place]. Evite consumir bebidas alcohlicas. Qu efectos secundarios puedo tener al [employer] este medicamento? Efectos secundarios que debe informar a su mdico o a Producer, television/film/video de la salud tan pronto como sea posible: -Therapist, art como erupcin cutnea, picazn o urticarias, hinchazn de la cara, labios o lengua -cambios en la visin -dificultad al respirar -pulso cardiaco rpido o palpitaciones -alucinaciones -aturdimiento o desmayos -vmito Efectos secundarios que, por lo general, no requieren Psychologist, prison and probation services (debe informarlos a su mdico o a su profesional de la salud si persisten o si son molestos): -mareos -somnolencia -dolor de cabeza -nuseas Puede ser que esta lista no menciona todos los posibles efectos secundarios. Comunquese a su mdico por asesoramiento mdico [employer]ard. Usted puede informar los efectos secundarios a la FDA por telfono al 1-800-FDA-1088. Dnde debo guardar mi medicina? Mantngala fuera del alcance de los nios. Gurdela a una temperatura de entre 15 y 30 grados C (59 y 36 grados F). Protjala de la luz. Mantenga el envase bien cerrado.  Deseche todo el medicamento que no haya utilizado, despus de la fecha de vencimiento. ATENCIN: Este folleto es un resumen. Puede ser que no cubra toda la posible informacin. Si usted tiene preguntas acerca de esta medicina, consulte con su mdico, su farmacutico o su profesional de Radiographer, therapeutic.  2012, Elsevier/Gold Standard. (10/29/2006 4:01:00 PM)  Naproxen and naproxen sodium oral  immediate-release tablets Qu es este medicamento? El NAPROXENO es un medicamento antiinflamatorio no esteroideo (AINE). Se utiliza para reducir la inflamacin y Corporate treasurer. Este medicamento se puede Chemical engineer para Nurse, adult, dolor de [place] y perodos menstruales dolorosos. Este medicamento tambin sirve para tratar problemas dolorosos de las articulaciones y los msculos, tales como artritis, tendinitis, bursitis y Secondary school teacher. Este medicamento puede ser utilizado para otros usos; si tiene alguna pregunta consulte con su proveedor de atencin mdica o con su farmacutico. Qu le debo informar a mi profesional de la salud antes de tomar este medicamento? Necesita saber si usted presenta alguno de los siguientes problemas o situaciones: -asma -fuma -consume ms de 3 bebidas alcohlicas por da -enfermedad cardiaca o problemas circulatorios, tales como insuficiencia cardiaca o edema de pierna (retencin de lquido) -alta presin sangunea -enfermedad renal -enfermedad heptica -lceras o sangrado estomacal -una reaccin alrgica o inusual al naproxeno, a la aspirina, a otros AINE, otros medicamentos, alimentos, colorantes o conservantes -si est embarazada o buscando quedar embarazada -si est amamantando a un beb Cmo debo [employer]? Tome este medicamento por va oral con un vaso de agua. Siga las instrucciones de la etiqueta del [place]. Si este medicamento le produce Programme researcher, broadcasting/film/video, tmelo con alimentos. Trate de no acostarse por lo menos 10 minutos despus de tomarlo. Tome sus dosis a intervalos regulares. No tome su medicamento con una frecuencia mayor a la indicada. El uso prolongado puede aumentar el riesgo de sufrir un ataque cardiaco o un derrame cerebral. Su farmacutico le dar una [place] del medicamento especial con cada receta y relleno. Asegrese de leer esta informacin cada vez cuidadosamente. Hable con su pediatra para informarse acerca del uso de este  medicamento en nios. Puede requerir atencin especial. Sobredosis: Pngase en contacto inmediatamente con un centro toxicolgico o una sala de urgencia si usted cree que haya tomado demasiado medicamento. ATENCIN: [employer] es solo para usted. No comparta este medicamento con nadie. Qu sucede si me olvido de una dosis? Si olvida una dosis, tmela lo antes posible. Si es casi la hora de la prxima dosis, tome slo esa dosis. No tome dosis adicionales o dobles. Qu puede interactuar con este medicamento? -alcohol -aspirina -cidofovir -diurticos -litio -metotrexato -otros medicamentos antiinflamatorios, tales Teacher, English as a foreign language o prednisona -pemetrexed -probenecid -warfarina Puede ser que esta lista no menciona todas las posibles interacciones. Informe a su profesional de [employer] de [employer] productos a base de hierbas, medicamentos de [place] o suplementos nutritivos que est tomando. Si usted fuma, consume bebidas alcohlicas o si utiliza drogas ilegales, indqueselo tambin a su profesional de [employer]. Algunas sustancias pueden interactuar con su medicamento. A qu debo estar atento al usar [employer]? Si el dolor no mejora, informe a su mdico o a su profesional de [employer]. Consulte con su mdico antes de tomar otros analgsicos. No se trate usted mismo. Este medicamento no previene ataques cardiacos o derrames cerebrales. De hecho, este medicamento puede aumentar la posibilidad de Marine scientist un ataque cardiaco o un derrame cerebral. La posibilidad puede aumentar con el uso prolongado de este medicamento y en pacientes con enfermedad  cardiaca. Si est tomando aspirina para la prevencin de ataques cardiacos o derrames cerebrales, comunquese con su mdico o su profesional de [employer]. Evite tomar otros medicamentos que contienen aspirina, ibuprofeno o naproxeno con [employer]. Es probable que se Special educational needs teacher secundarios, tales como molestias estomacales, nuseas  o lceras. No debe de tomar [employer] con muchos medicamentos disponibles de [employer]. Este medicamento puede provocar lceras y hemorragia del estmago e intestinos en cualquier momento durante tratamiento. No fume ni ingiera alcohol. Esto irrita an ms el estmago y puede hacerlo ms susceptible a dao por el uso de [employer]. Pueden ocurrir lceras y hemorragia sin sntomas de Control and instrumentation engineer y Financial controller la [place]. Puede experimentar mareos o somnolencia. No conduzca ni utilice maquinaria ni haga nada que Scientist, research (life sciences) en estado de alerta hasta que sepa cmo le afecta este medicamento. No se siente ni se ponga de pie con rapidez, especialmente si es un paciente de edad avanzada. Esto reduce el riesgo de mareos o [employer]. Este medicamento puede hacerle sangrar con mayor facilidad. Trate de no lastimarse los dientes y las encas al cepillarlos o limpiarlos con hilo dental. Qu efectos secundarios puedo tener al [employer] este medicamento? Efectos secundarios que debe informar a su mdico o a Producer, television/film/video de la salud tan pronto como sea posible: -heces de color oscuro o con sangre, sangre en la orina o vmito con sangre -visin borrosa -dolor en el pecho -dificultad al respirar o sibilancias -nuseas o vmito -dolor de estmago severo -erupcin cutnea, enrojecimiento, ampollas o descamacin de la piel, urticarias o picazn -hablar arrastrando las palabras o debilidad en un lado del cuerpo -hinchazn de prpados, garganta o labios -aumento de peso o hinchazn que no tienen explicacin -cansancio o debilidad inusual -color amarillento de los ojos o la piel Efectos secundarios que, por lo general, no requieren atencin mdica (debe informarlos a su mdico o a su profesional de la salud si persisten o si son molestos): -estreimiento -dolor de cabeza -acidez de estmago Puede ser que esta lista no menciona todos los posibles efectos secundarios. Comunquese a su mdico por  asesoramiento mdico [employer]. Usted puede informar los efectos secundarios a la FDA por telfono al 1-800-FDA-1088. Dnde debo guardar mi medicina? Mantngala fuera del alcance de los nios. Gurdela a [employer], entre 15 y 30 grados C (41 y 54 grados F). Mantenga le envase bien cerrado. Deseche todo el medicamento que no haya utilizado, despus de la fecha de vencimiento. ATENCIN: Este folleto es un resumen. Puede ser que no cubra toda la posible informacin. Si usted tiene preguntas acerca de esta medicina, consulte con su mdico, su farmacutico o su profesional de Radiographer, therapeutic.  2012, Elsevier/Gold Standard. (02/01/2009 4:00:30 PM)  Acetaminophen; Oxycodone tablets Qu es este medicamento? El compuesto ACETAMINOFENO; OXICODONA es un analgsico. Se utiliza para tratar los dolores leves a moderados. Este medicamento puede ser utilizado para otros usos; si tiene alguna pregunta consulte con su proveedor de atencin mdica o con su farmacutico. Qu le debo informar a mi profesional de la salud antes de tomar este medicamento? Necesita saber si usted presenta alguno de los siguientes problemas o situaciones: -tumor cerebral -enfermedad de [place], enfermedad intestinal inflamatoria o colitis ulcerativa -consume ms de 3 bebidas alcohlicas por da -abuso de drogas o drogadiccin -lesin de la cabeza -problemas cardiacos o circulatorios -enfermedad renal o problemas al Beatrix Shipper -enfermedad heptica -enfermedad pulmonar, asma o dificultades al respirar -una reaccin alrgica o inusual al acetaminofeno, a la  oxicodona, a otros analgsicos opiceos, a otros medicamentos, alimentos, colorantes o conservantes -si est embarazada o buscando quedar embarazada -si est amamantando a un beb Cmo debo utilizar este medicamento? Tome este medicamento por va oral con un vaso lleno de agua. Siga las instrucciones de la etiqueta del [place]. Tome sus dosis a intervalos  regulares. No tome su medicamento con una frecuencia mayor que la indicada. Hable con su pediatra para informarse acerca del uso de este medicamento en nios. Puede requerir Customer service manager. Los pacientes de ms de 65 aos de edad pueden presentar reacciones ms fuertes a Industrial/product designer y Pension scheme manager dosis menores. Sobredosis: Pngase en contacto inmediatamente con un centro toxicolgico o una sala de urgencia si usted cree que haya tomado demasiado medicamento. ATENCIN: [employer] es solo para usted. No comparta este medicamento con nadie. Qu sucede si me olvido de una dosis? Si olvida una dosis, tmela lo antes posible. Si es casi la hora de la prxima dosis, tome slo esa dosis. No tome dosis adicionales o dobles. Qu puede interactuar con este medicamento? -alcohol o medicamentos que contienen alcohol -antihistamnicos -barbitricos tales como el amobarbital, butalbital, butabarbital, metohexital, pentobarbital, fenobarbital, tiopental y secobarbital -benztropina -medicamentos para problemas de vejiga, tales como solifenacina, trospium, oxibutinina, tolterodina, hiosciamina y metscopolamina -medicamentos para problemas respiratorios, tales como ipratropio y tiotropio -medicamentos para ciertos problemas estomacales o intestinales, tales como propantelina, homatropina metilbromuro, Dispensing optician, atropina, belladona y diciclomina -anestsicos generales, tales como etomidato, [place], xido nitroso, propofol, desflurano, enflurano, halotano, isoflurano y sevoflurano -medicamentos para la depresin, ansiedad o trastornos psicticos -analgsicos, incluyendo codena, morfina, pentazocina, buprenorfina, butorfanol, nalbufina, tramadol y propoxifeno -medicamentos para dormir -relajantes musculares -naltrexona -fenotiazinas, tales como perfenacina, tioridazina, clorpromacina, mesoridazina, flufenazina, proclorperazina, promazina y trifluoperazina -escopolamina -trihexifenidilo Puede  ser que esta lista no menciona todas las posibles interacciones. Informe a su profesional de [employer] de [employer] productos a base de hierbas, medicamentos de [place] o suplementos nutritivos que est tomando. Si usted fuma, consume bebidas alcohlicas o si utiliza drogas ilegales, indqueselo tambin a su profesional de [employer]. Algunas sustancias pueden interactuar con su medicamento. A qu debo estar atento al usar [employer]? Si el dolor no desaparece, si empeora o si experimenta un dolor nuevo o de tipo diferente, consulte a su mdico o a su profesional de [employer]. Usted puede desarrollar tolerancia al medicamento. La tolerancia significa que necesitar una dosis ms alta para Engineer, materials. Tolerancia es normal y esperada cuando est tomando este medicamento por un largo perodo de [place]. No suspenda el uso de su medicamento repentinamente debido a que puede Copywriter, advertising reaccin severa. Su cuerpo se acostumbra a Industrial/product designer. Esto NO significa que sea adicto. La adiccin es un comportamiento que hace referencia a la obtencin y utilizacin de un medicamento con fines que no son mdicos. Si tiene Engineer, mining, existe una razn mdica para que usted tome un analgsico. Su mdico le indicar la cantidad de medicamento que Mudlogger. Si su mdico desea que [employer], la dosis ser reducida gradualmente para Psychiatric nurse secundarios. Puede experimentar somnolencia o mareos. No conduzca ni utilice maquinaria ni haga nada que Scientist, research (life sciences) en estado de alerta hasta que sepa cmo le afecta este medicamento. No se siente ni se ponga de pie con rapidez, especialmente si es un paciente de edad avanzada. Esto reduce el riesgo de mareos o [employer]. El alcohol puede interferir con el efecto de [place]. Evite consumir bebidas alcohlicas. Este medicamento causar estreimiento. Trate de  evacuar los intestinos al menos cada 2  3 das. Si no evacua los intestinos  durante [address]  [address], comunquese con su mdico o con su profesional de [employer]. No tome Tylenol (acetaminofeno) u otros medicamentos que contienen acetaminofeno con este medicamento. Tomando mucho acetaminofeno puede ser muy peligroso. Muchos medicamentos de venta libre contienen acetaminofeno. Lea siempre las etiquetas cuidadosamente para evitar el tomar ms acetaminofeno. Qu efectos secundarios puedo tener al [employer] este medicamento? Efectos secundarios que debe informar a su mdico o a Producer, television/film/video de la salud tan pronto como sea posible: -Scientist, physiological, tales como erupcin cutnea, picazn o urticarias, hinchazn de la cara, labios o lengua -dificultades respiratorias, sibilancias -confusin -aturdimiento o desmayos -dolor de estmago severo -color amarillento de los ojos o la piel Efectos secundarios que, por lo general, no requieren atencin mdica (debe informarlos a su mdico o a su profesional de la salud si persisten o si son molestos): -mareos -somnolencia -nuseas -vmitos Puede ser que esta lista no menciona todos los posibles efectos secundarios. Comunquese a su mdico por asesoramiento mdico [employer]. Usted puede informar los efectos secundarios a la FDA por telfono al 1-800-FDA-1088. Dnde debo guardar mi medicina? Mantngala fuera del alcance de los nios. Este medicamento puede ser abusado. Mantenga su medicamento en un lugar seguro para protegerlo contra robos. No comparta este medicamento con nadie. Es peligroso vender o ceder este medicamento y est prohibido por la ley. Gurdelo a [employer], entre 20 y 25 grados C (46 y 41 grados F). Mantenga el envase bien cerrado. Protjalo de Statistician. Tire los medicamentos que no haya utilizado al inodoro. No utilice el medicamento despus de la fecha de vencimiento. ATENCIN: Este folleto es un resumen. Puede ser que no cubra toda la posible informacin. Si usted tiene preguntas acerca de esta  medicina, consulte con su mdico, su farmacutico o su profesional de Radiographer, therapeutic.  2012, Elsevier/Gold Standard. (03/11/2008 4:51:55 PM)

## 2011-01-27 ENCOUNTER — Encounter (HOSPITAL_COMMUNITY): Payer: Self-pay | Admitting: *Deleted

## 2011-01-27 ENCOUNTER — Emergency Department (HOSPITAL_COMMUNITY)
Admission: EM | Admit: 2011-01-27 | Discharge: 2011-01-27 | Disposition: A | Discharge status: Home / Self Care | Payer: Self-pay | Attending: Emergency Medicine | Admitting: Emergency Medicine

## 2011-01-27 ENCOUNTER — Emergency Department (HOSPITAL_COMMUNITY): Payer: Self-pay

## 2011-01-27 DIAGNOSIS — M545 Pain in right leg: Secondary | ICD-10-CM

## 2011-01-27 DIAGNOSIS — Z79899 Other long term (current) drug therapy: Secondary | ICD-10-CM | POA: Insufficient documentation

## 2011-01-27 DIAGNOSIS — IMO0001 Reserved for inherently not codable concepts without codable children: Secondary | ICD-10-CM | POA: Insufficient documentation

## 2011-01-27 DIAGNOSIS — M79609 Pain in unspecified limb: Secondary | ICD-10-CM | POA: Insufficient documentation

## 2011-01-27 LAB — URINE MICROSCOPIC-ADD ON

## 2011-01-27 LAB — POCT I-STAT, CHEM 8
BUN: 23 mg/dL (ref 6–23)
Calcium, Ion: 1.21 mmol/L (ref 1.12–1.32)
Chloride: 106 mEq/L (ref 96–112)
Creatinine, Ser: 0.7 mg/dL (ref 0.50–1.10)
Glucose, Bld: 88 mg/dL (ref 70–99)
HCT: 44 % (ref 36.0–46.0)
Hemoglobin: 15 g/dL (ref 12.0–15.0)
Potassium: 3.8 mEq/L (ref 3.5–5.1)
Sodium: 141 mEq/L (ref 135–145)
TCO2: 25 mmol/L (ref 0–100)

## 2011-01-27 LAB — URINALYSIS, ROUTINE W REFLEX MICROSCOPIC
Glucose, UA: NEGATIVE mg/dL
Ketones, ur: 15 mg/dL — AB
Nitrite: NEGATIVE
Protein, ur: NEGATIVE mg/dL
Specific Gravity, Urine: 1.038 — ABNORMAL HIGH (ref 1.005–1.030)
Urobilinogen, UA: 0.2 mg/dL (ref 0.0–1.0)
pH: 5.5 (ref 5.0–8.0)

## 2011-01-27 LAB — DIFFERENTIAL
Basophils Absolute: 0 10*3/uL (ref 0.0–0.1)
Basophils Relative: 0 % (ref 0–1)
Eosinophils Absolute: 0.1 10*3/uL (ref 0.0–0.7)
Eosinophils Relative: 1 % (ref 0–5)
Lymphocytes Relative: 20 % (ref 12–46)
Lymphs Abs: 2.7 10*3/uL (ref 0.7–4.0)
Monocytes Absolute: 1 10*3/uL (ref 0.1–1.0)
Monocytes Relative: 7 % (ref 3–12)
Neutro Abs: 9.8 10*3/uL — ABNORMAL HIGH (ref 1.7–7.7)
Neutrophils Relative %: 72 % (ref 43–77)

## 2011-01-27 LAB — CBC
HCT: 42.2 % (ref 36.0–46.0)
Hemoglobin: 14.3 g/dL (ref 12.0–15.0)
MCH: 29.5 pg (ref 26.0–34.0)
MCHC: 33.9 g/dL (ref 30.0–36.0)
MCV: 87.2 fL (ref 78.0–100.0)
Platelets: 317 10*3/uL (ref 150–400)
RBC: 4.84 MIL/uL (ref 3.87–5.11)
RDW: 12.8 % (ref 11.5–15.5)
WBC: 13.5 10*3/uL — ABNORMAL HIGH (ref 4.0–10.5)

## 2011-01-27 MED ORDER — TRAMADOL-ACETAMINOPHEN 37.5-325 MG PO TABS: 1.0000 | ORAL_TABLET | Freq: Four times a day (QID) | ORAL | Status: AC | PRN

## 2011-01-27 NOTE — ED Notes (Signed)
Pt resting quietly with no complaints, waiting for MRI

## 2011-01-27 NOTE — ED Provider Notes (Signed)
History     CSN: [id number]  Arrival date & time 01/27/11  1533   First MD Initiated Contact with Patient 01/27/11 1550      Chief Complaint  Patient presents with  . Leg Pain    (Consider location/radiation/quality/duration/timing/severity/associated sxs/prior treatment) HPI  36 year old female presenting to the ED with chief complaints of right leg pain. Patient states for the past 3 weeks she has been having pain which started from the right buttock and radiating to the back of her right thigh. Pain worsened with sitting and bending. Patient described pain as sharp, shooting, with tingling sensation. She was seen and evaluated in the ED 8 days ago and was diagnosed with right-sided sciatica. Which time she was given NSAIDs, narcotic pain medication, muscle relaxant, and a short course of steroid. Patient states medication has helped minimally but continued to endorse pain. She also complained of occasional urinary and in retention but denies bowel incontinence or caudal equina symptoms. Patient denies weakness or foot drop. Patient also mentioned that she is having a cold but that has resolved. However, states her pain has been ongoing longer than her recent cold. She denies any specific trauma. Patient denies headache, chest pain, shortness of breath, abdominal pain, nausea, vomiting, diarrhea, dysuria.  Patient also denies IV drug use or having a rash.    History reviewed. No pertinent past medical history.  Past Surgical History  Procedure Date  . Cesarean section     History reviewed. No pertinent family history.  History  Substance Use Topics  . Smoking status: Never Smoker   . Smokeless tobacco: Not on file  . Alcohol Use: No    OB History    Grav Para Term Preterm Abortions TAB SAB Ect Mult Living                  Review of Systems  All other systems reviewed and are negative.    Allergies  Review of patient's allergies indicates no known allergies.  Home  Medications   Current Outpatient Rx  Name Route Sig Dispense Refill  . ACETAMINOPHEN 325 MG PO TABS Oral Take 650 mg by mouth every 6 (six) hours as needed. For pain/fever    . NAPROXEN 500 MG PO TABS Oral Take 500 mg by mouth 2 (two) times daily.    . ORPHENADRINE CITRATE ER 100 MG PO TB12 Oral Take 100 mg by mouth 2 (two) times daily.    . OXYCODONE-ACETAMINOPHEN 5-325 MG PO TABS Oral Take 1 tablet by mouth every 4 (four) hours as needed. For pain    . PREDNISONE 50 MG PO TABS Oral Take 50 mg by mouth daily.      BP 138/81  Pulse 98  Temp(Src) 97.6 F (36.4 C) (Oral)  Resp 16  Ht 5\' 1"  (1.549 m)  Wt 175 lb (79.379 kg)  BMI 33.07 kg/m2  SpO2 97%  LMP 01/03/2011  Physical Exam  Nursing note and vitals reviewed. Constitutional:       Awake, alert, nontoxic appearance  HENT:  Head: Atraumatic.  Eyes: Right eye exhibits no discharge. Left eye exhibits no discharge.  Neck: Neck supple.  Pulmonary/Chest: Effort normal. She exhibits no tenderness.  Abdominal: Soft. There is no tenderness. There is no rebound.  Musculoskeletal: She exhibits no tenderness.       Baseline ROM, no obvious new focal weakness.  Patient has no midline back tenderness. However, pain is elicited with right leg raise greater than 50. Pain to dorsum of  thigh.  Patella DTR 2+ bilaterally.  No foot drop  Neurological:       Mental status and motor strength appears baseline for patient and situation  Skin: No rash noted.  Psychiatric: She has a normal mood and affect.    ED Course  Procedures (including critical care time)  Labs Reviewed - No data to display No results found.   No diagnosis found.    MDM  Low back exam is unremarkable.  No evidence of infection, or overlying skin changes.  Pain elicited with straight leg raise to her right leg. Decrease rectal tone. Patellar deep tendon reflex 2+ bilaterally. No foot drops.  The patient has symptoms suggestive of sciatica, however with a complaints  of urinary retention, I will obtain MRI of low back for further evaluation to rule out nerve impingement. Abdomen is benign on exam, she has no CVA tenderness.  7:01 PM MRI of the lumbar region reveal a proximal based moderate sized central disc protrusion at L5-S1 with right and left component causing probable bilateral S1 nerve root encroachment. No L5 nerve root encroachment is seen. There is mild disc bulge and left foraminal annular rent at L4-L5. No associated nerve root in encroachment. 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  In light of this finding, we'll consult with neurology for further management.    11:47 PM Pt has been discharged to follow up with neurosurgeon for further management.  Bladder scan reveal no urinary retention.  Pt was discharged by Marlon Pel, PA-C.  Pt has been discussed with Dr. Raeford Razor who agrees with plan.    Fayrene Helper, PA-C 01/27/11 2348

## 2011-01-27 NOTE — ED Provider Notes (Signed)
Patient presents to the emergency department with complaints of back pain. The patient was seen in the ED a couple of days ago and diagnosed with sciatica. The patient was originally seen by spell word BOWIE TRAN PA-C. And the patient described symptoms of urinary retention therefore an MR by was ordered to rule out nerve impingement. The MRI showed some encroachment on L5. After discussing the case with Dr. Juleen China, it was decided that I would do a bladder scan to rule out true urinary retention. The patient voided in the ER and a bladder scan was done right after, which showed the patient to have retained 52 ml of urine. This is not true urinary retention. I have discussed results with the patient, and the patient understands the plan. Patient is to be seen for a followup appointments with neurosurgery. The patient is currently taking naproxen, prednisone, and 5 mg of Percocet. I am going to change the medication, Percocet, and did a higher dose of Lortab.  Dorthula Matas, PA 01/27/11 2107

## 2011-01-27 NOTE — ED Notes (Signed)
She has multiple symptoms.  Rt thigh pain posterior for 3 weeks and she has a cold and cough

## 2011-01-27 NOTE — ED Notes (Signed)
Pt c/o pain  In right leg x's 3 weeks, also pain in right buttock

## 2011-01-27 NOTE — ED Provider Notes (Signed)
Medical screening examination/treatment/procedure(s) were performed by non-physician practitioner and as supervising physician I was immediately available for consultation/collaboration.   Dione Booze, MD 01/27/11 [phone]

## 2011-01-27 NOTE — ED Notes (Signed)
Bladder scan is done pt has a residual of 52 ml's.

## 2011-01-30 NOTE — ED Provider Notes (Signed)
Medical screening examination/treatment/procedure(s) were performed by non-physician practitioner and as supervising physician I was immediately available for consultation/collaboration.  Nekeshia Lenhardt, MD 01/30/11 0932 

## 2011-08-28 ENCOUNTER — Encounter (HOSPITAL_COMMUNITY): Payer: Self-pay | Admitting: Critical Care Medicine

## 2011-08-28 ENCOUNTER — Observation Stay (HOSPITAL_COMMUNITY): Payer: Medicaid Other | Admitting: Critical Care Medicine

## 2011-08-28 ENCOUNTER — Observation Stay (HOSPITAL_COMMUNITY)
Admission: EM | Admit: 2011-08-28 | Discharge: 2011-08-29 | Disposition: A | Discharge status: Home / Self Care | Payer: Medicaid Other | Attending: General Surgery | Admitting: General Surgery

## 2011-08-28 ENCOUNTER — Encounter (HOSPITAL_COMMUNITY)
Admission: EM | Disposition: A | Discharge status: Home / Self Care | Payer: Self-pay | Source: Home / Self Care | Attending: Emergency Medicine

## 2011-08-28 ENCOUNTER — Encounter (HOSPITAL_COMMUNITY): Payer: Self-pay | Admitting: *Deleted

## 2011-08-28 ENCOUNTER — Observation Stay (HOSPITAL_COMMUNITY): Payer: Medicaid Other

## 2011-08-28 ENCOUNTER — Encounter (HOSPITAL_COMMUNITY): Payer: Self-pay | Admitting: General Surgery

## 2011-08-28 ENCOUNTER — Emergency Department (HOSPITAL_COMMUNITY): Payer: Medicaid Other

## 2011-08-28 DIAGNOSIS — K8010 Calculus of gallbladder with chronic cholecystitis without obstruction: Secondary | ICD-10-CM

## 2011-08-28 DIAGNOSIS — K8000 Calculus of gallbladder with acute cholecystitis without obstruction: Principal | ICD-10-CM | POA: Diagnosis present

## 2011-08-28 DIAGNOSIS — K819 Cholecystitis, unspecified: Secondary | ICD-10-CM

## 2011-08-28 DIAGNOSIS — N200 Calculus of kidney: Secondary | ICD-10-CM | POA: Insufficient documentation

## 2011-08-28 HISTORY — DX: Calculus of gallbladder with acute cholecystitis without obstruction: K80.00

## 2011-08-28 HISTORY — PX: CHOLECYSTECTOMY: SHX55

## 2011-08-28 LAB — CBC WITH DIFFERENTIAL/PLATELET
Basophils Absolute: 0 10*3/uL (ref 0.0–0.1)
Basophils Relative: 0 % (ref 0–1)
Eosinophils Absolute: 0.1 10*3/uL (ref 0.0–0.7)
Eosinophils Relative: 1 % (ref 0–5)
HCT: 43.5 % (ref 36.0–46.0)
Hemoglobin: 15.2 g/dL — ABNORMAL HIGH (ref 12.0–15.0)
Lymphocytes Relative: 12 % (ref 12–46)
Lymphs Abs: 1.5 10*3/uL (ref 0.7–4.0)
MCH: 30 pg (ref 26.0–34.0)
MCHC: 34.9 g/dL (ref 30.0–36.0)
MCV: 86 fL (ref 78.0–100.0)
Monocytes Absolute: 0.7 10*3/uL (ref 0.1–1.0)
Monocytes Relative: 6 % (ref 3–12)
Neutro Abs: 9.7 10*3/uL — ABNORMAL HIGH (ref 1.7–7.7)
Neutrophils Relative %: 80 % — ABNORMAL HIGH (ref 43–77)
Platelets: 301 10*3/uL (ref 150–400)
RBC: 5.06 MIL/uL (ref 3.87–5.11)
RDW: 12.5 % (ref 11.5–15.5)
WBC: 12 10*3/uL — ABNORMAL HIGH (ref 4.0–10.5)

## 2011-08-28 LAB — POCT I-STAT, CHEM 8
BUN: 9 mg/dL (ref 6–23)
Calcium, Ion: 1.18 mmol/L (ref 1.12–1.23)
Chloride: 105 mEq/L (ref 96–112)
Creatinine, Ser: 0.7 mg/dL (ref 0.50–1.10)
Glucose, Bld: 127 mg/dL — ABNORMAL HIGH (ref 70–99)
HCT: 47 % — ABNORMAL HIGH (ref 36.0–46.0)
Hemoglobin: 16 g/dL — ABNORMAL HIGH (ref 12.0–15.0)
Potassium: 3.9 mEq/L (ref 3.5–5.1)
Sodium: 139 mEq/L (ref 135–145)
TCO2: 21 mmol/L (ref 0–100)

## 2011-08-28 LAB — COMPREHENSIVE METABOLIC PANEL
ALT: 52 U/L — ABNORMAL HIGH (ref 0–35)
AST: 25 U/L (ref 0–37)
Albumin: 4 g/dL (ref 3.5–5.2)
Alkaline Phosphatase: 93 U/L (ref 39–117)
BUN: 10 mg/dL (ref 6–23)
CO2: 22 mEq/L (ref 19–32)
Calcium: 9.3 mg/dL (ref 8.4–10.5)
Chloride: 102 mEq/L (ref 96–112)
Creatinine, Ser: 0.59 mg/dL (ref 0.50–1.10)
GFR calc Af Amer: >90 mL/min (ref >90)
GFR calc non Af Amer: >90 mL/min (ref >90)
Glucose, Bld: 124 mg/dL — ABNORMAL HIGH (ref 70–99)
Potassium: 3.9 mEq/L (ref 3.5–5.1)
Sodium: 135 mEq/L (ref 135–145)
Total Bilirubin: 0.4 mg/dL (ref 0.3–1.2)
Total Protein: 7.8 g/dL (ref 6.0–8.3)

## 2011-08-28 LAB — URINE MICROSCOPIC-ADD ON

## 2011-08-28 LAB — URINALYSIS, ROUTINE W REFLEX MICROSCOPIC
Bilirubin Urine: NEGATIVE
Glucose, UA: NEGATIVE mg/dL
Hgb urine dipstick: NEGATIVE
Ketones, ur: >80 mg/dL — AB
Leukocytes, UA: NEGATIVE
Nitrite: NEGATIVE
Protein, ur: 30 mg/dL — AB
Specific Gravity, Urine: 1.028 (ref 1.005–1.030)
Urobilinogen, UA: 1 mg/dL (ref 0.0–1.0)
pH: 6 (ref 5.0–8.0)

## 2011-08-28 LAB — LIPASE, BLOOD: Lipase: 18 U/L (ref 11–59)

## 2011-08-28 LAB — POCT PREGNANCY, URINE: Preg Test, Ur: NEGATIVE

## 2011-08-28 SURGERY — LAPAROSCOPIC CHOLECYSTECTOMY WITH INTRAOPERATIVE CHOLANGIOGRAM
Anesthesia: General | Site: Abdomen | Wound class: Clean Contaminated

## 2011-08-28 MED ORDER — 0.9 % SODIUM CHLORIDE (POUR BTL) OPTIME
TOPICAL | Status: DC | PRN
  Administered 2011-08-28: 1000 mL

## 2011-08-28 MED ORDER — PANTOPRAZOLE SODIUM 40 MG IV SOLR
40.0000 mg | Freq: Once | INTRAVENOUS | Status: AC
  Administered 2011-08-28: 40 mg via INTRAVENOUS
  Filled 2011-08-28: qty 40

## 2011-08-28 MED ORDER — HYDROMORPHONE HCL PF 1 MG/ML IJ SOLN
0.2500 mg | INTRAMUSCULAR | Status: DC | PRN
  Administered 2011-08-28: 0.25 mg via INTRAVENOUS
  Administered 2011-08-28 (×2): 0.5 mg via INTRAVENOUS
  Administered 2011-08-28: 0.25 mg via INTRAVENOUS

## 2011-08-28 MED ORDER — SODIUM CHLORIDE 0.9 % IV BOLUS (SEPSIS)
1000.0000 mL | Freq: Once | INTRAVENOUS | Status: AC
  Administered 2011-08-28: 1000 mL via INTRAVENOUS

## 2011-08-28 MED ORDER — DIPHENHYDRAMINE HCL 50 MG/ML IJ SOLN: 12.5000 mg | Freq: Four times a day (QID) | INTRAMUSCULAR | Status: DC | PRN

## 2011-08-28 MED ORDER — ACETAMINOPHEN 325 MG PO TABS: 650.0000 mg | ORAL_TABLET | Freq: Four times a day (QID) | ORAL | Status: DC | PRN

## 2011-08-28 MED ORDER — MIDAZOLAM HCL 5 MG/5ML IJ SOLN
INTRAMUSCULAR | Status: DC | PRN
  Administered 2011-08-28: 2 mg via INTRAVENOUS

## 2011-08-28 MED ORDER — GI COCKTAIL ~~LOC~~
30.0000 mL | Freq: Once | ORAL | Status: AC
  Administered 2011-08-28: 30 mL via ORAL
  Filled 2011-08-28: qty 30

## 2011-08-28 MED ORDER — PROMETHAZINE HCL 25 MG/ML IJ SOLN: 6.2500 mg | INTRAMUSCULAR | Status: DC | PRN

## 2011-08-28 MED ORDER — MIDAZOLAM HCL 2 MG/2ML IJ SOLN
0.5000 mg | Freq: Once | INTRAMUSCULAR | Status: AC | PRN
  Administered 2011-08-28: 0.5 mg via INTRAVENOUS

## 2011-08-28 MED ORDER — ROCURONIUM BROMIDE 100 MG/10ML IV SOLN
INTRAVENOUS | Status: DC | PRN
  Administered 2011-08-28: 30 mg via INTRAVENOUS

## 2011-08-28 MED ORDER — ACETAMINOPHEN 650 MG RE SUPP: 650.0000 mg | Freq: Four times a day (QID) | RECTAL | Status: DC | PRN

## 2011-08-28 MED ORDER — SODIUM CHLORIDE 0.9 % IR SOLN
Status: DC | PRN
  Administered 2011-08-28: 1000 mL

## 2011-08-28 MED ORDER — ONDANSETRON HCL 4 MG/2ML IJ SOLN
4.0000 mg | Freq: Once | INTRAMUSCULAR | Status: AC
  Administered 2011-08-28: 4 mg via INTRAVENOUS
  Filled 2011-08-28: qty 2

## 2011-08-28 MED ORDER — SODIUM CHLORIDE 0.9 % IV BOLUS (SEPSIS): 1000.0000 mL | Freq: Once | INTRAVENOUS | Status: DC

## 2011-08-28 MED ORDER — CIPROFLOXACIN IN D5W 400 MG/200ML IV SOLN
400.0000 mg | Freq: Two times a day (BID) | INTRAVENOUS | Status: DC
  Administered 2011-08-29: 400 mg via INTRAVENOUS
  Filled 2011-08-28 (×2): qty 200

## 2011-08-28 MED ORDER — NEOSTIGMINE METHYLSULFATE 1 MG/ML IJ SOLN
INTRAMUSCULAR | Status: DC | PRN
  Administered 2011-08-28: 4 mg via INTRAVENOUS

## 2011-08-28 MED ORDER — SUCCINYLCHOLINE CHLORIDE 20 MG/ML IJ SOLN
INTRAMUSCULAR | Status: DC | PRN
  Administered 2011-08-28: 100 mg via INTRAVENOUS

## 2011-08-28 MED ORDER — IOHEXOL 300 MG/ML  SOLN
80.0000 mL | Freq: Once | INTRAMUSCULAR | Status: AC | PRN
  Administered 2011-08-28: 80 mL via INTRAVENOUS

## 2011-08-28 MED ORDER — SODIUM CHLORIDE 0.9 % IV SOLN
INTRAVENOUS | Status: DC | PRN
  Administered 2011-08-28: 14:00:00

## 2011-08-28 MED ORDER — IOHEXOL 300 MG/ML  SOLN
20.0000 mL | INTRAMUSCULAR | Status: AC
  Administered 2011-08-28 (×2): 20 mL via ORAL

## 2011-08-28 MED ORDER — LIDOCAINE HCL (CARDIAC) 20 MG/ML IV SOLN
INTRAVENOUS | Status: DC | PRN
  Administered 2011-08-28: 80 mg via INTRAVENOUS

## 2011-08-28 MED ORDER — OXYCODONE-ACETAMINOPHEN 5-325 MG PO TABS
1.0000 | ORAL_TABLET | ORAL | Status: DC | PRN
  Administered 2011-08-29: 2 via ORAL
  Filled 2011-08-28: qty 2

## 2011-08-28 MED ORDER — CIPROFLOXACIN IN D5W 400 MG/200ML IV SOLN
400.0000 mg | Freq: Two times a day (BID) | INTRAVENOUS | Status: DC
  Administered 2011-08-28 (×2): 400 mg via INTRAVENOUS
  Filled 2011-08-28 (×2): qty 200

## 2011-08-28 MED ORDER — ONDANSETRON HCL 4 MG/2ML IJ SOLN: 4.0000 mg | Freq: Four times a day (QID) | INTRAMUSCULAR | Status: DC | PRN

## 2011-08-28 MED ORDER — FENTANYL CITRATE 0.05 MG/ML IJ SOLN
INTRAMUSCULAR | Status: DC | PRN
  Administered 2011-08-28 (×2): 100 ug via INTRAVENOUS
  Administered 2011-08-28: 25 ug via INTRAVENOUS
  Administered 2011-08-28: 50 ug via INTRAVENOUS

## 2011-08-28 MED ORDER — ONDANSETRON HCL 4 MG/2ML IJ SOLN
INTRAMUSCULAR | Status: DC | PRN
  Administered 2011-08-28: 4 mg via INTRAVENOUS

## 2011-08-28 MED ORDER — MEPERIDINE HCL 25 MG/ML IJ SOLN
6.2500 mg | INTRAMUSCULAR | Status: DC | PRN
  Administered 2011-08-28: 12.5 mg via INTRAVENOUS

## 2011-08-28 MED ORDER — DIPHENHYDRAMINE HCL 12.5 MG/5ML PO ELIX
12.5000 mg | ORAL_SOLUTION | Freq: Four times a day (QID) | ORAL | Status: DC | PRN
  Filled 2011-08-28: qty 5

## 2011-08-28 MED ORDER — SODIUM CHLORIDE 0.9 % IV SOLN
Freq: Once | INTRAVENOUS | Status: AC
  Administered 2011-08-28: 09:00:00 via INTRAVENOUS

## 2011-08-28 MED ORDER — PROPOFOL 10 MG/ML IV EMUL
INTRAVENOUS | Status: DC | PRN
  Administered 2011-08-28: 180 mg via INTRAVENOUS

## 2011-08-28 MED ORDER — BUPIVACAINE-EPINEPHRINE 0.25% -1:200000 IJ SOLN
INTRAMUSCULAR | Status: DC | PRN
  Administered 2011-08-28: 15 mL

## 2011-08-28 MED ORDER — GLYCOPYRROLATE 0.2 MG/ML IJ SOLN
INTRAMUSCULAR | Status: DC | PRN
  Administered 2011-08-28: 0.6 mg via INTRAVENOUS

## 2011-08-28 MED ORDER — POTASSIUM CHLORIDE IN NACL 20-0.9 MEQ/L-% IV SOLN
INTRAVENOUS | Status: DC
  Administered 2011-08-28: 1000 mL via INTRAVENOUS
  Administered 2011-08-29: 06:00:00 via INTRAVENOUS
  Filled 2011-08-28 (×5): qty 1000

## 2011-08-28 MED ORDER — DEXAMETHASONE SODIUM PHOSPHATE 4 MG/ML IJ SOLN
INTRAMUSCULAR | Status: DC | PRN
  Administered 2011-08-28: 4 mg via INTRAVENOUS

## 2011-08-28 MED ORDER — LACTATED RINGERS IV SOLN
INTRAVENOUS | Status: DC | PRN
  Administered 2011-08-28: 14:00:00 via INTRAVENOUS

## 2011-08-28 MED ORDER — HYDROMORPHONE HCL PF 1 MG/ML IJ SOLN
0.5000 mg | INTRAMUSCULAR | Status: DC | PRN
  Administered 2011-08-29: 1 mg via INTRAVENOUS
  Filled 2011-08-28: qty 1

## 2011-08-28 SURGICAL SUPPLY — 56 items
ADH SKN CLS APL DERMABOND .7 (GAUZE/BANDAGES/DRESSINGS)
ADH SKN CLS LQ APL DERMABOND (GAUZE/BANDAGES/DRESSINGS) ×1
APPLIER CLIP 5 13 M/L LIGAMAX5 (MISCELLANEOUS) ×2
APPLIER CLIP ROT 10 11.4 M/L (STAPLE)
APR CLP MED LRG 11.4X10 (STAPLE)
APR CLP MED LRG 5 ANG JAW (MISCELLANEOUS) ×1
BAG SPEC RTRVL LRG 6X4 10 (ENDOMECHANICALS) ×1
BLADE SURG ROTATE 9660 (MISCELLANEOUS) IMPLANT
CANISTER SUCTION 2500CC (MISCELLANEOUS) ×2 IMPLANT
CHLORAPREP W/TINT 26ML (MISCELLANEOUS) ×2 IMPLANT
CLIP APPLIE 5 13 M/L LIGAMAX5 (MISCELLANEOUS) ×1 IMPLANT
CLIP APPLIE ROT 10 11.4 M/L (STAPLE) IMPLANT
CLOTH BEACON ORANGE TIMEOUT ST (SAFETY) ×2 IMPLANT
COVER MAYO STAND STRL (DRAPES) ×2 IMPLANT
COVER SURGICAL LIGHT HANDLE (MISCELLANEOUS) ×2 IMPLANT
DECANTER SPIKE VIAL GLASS SM (MISCELLANEOUS) ×2 IMPLANT
DERMABOND ADHESIVE PROPEN (GAUZE/BANDAGES/DRESSINGS) ×1
DERMABOND ADVANCED (GAUZE/BANDAGES/DRESSINGS)
DERMABOND ADVANCED .7 DNX12 (GAUZE/BANDAGES/DRESSINGS) ×1 IMPLANT
DERMABOND ADVANCED .7 DNX6 (GAUZE/BANDAGES/DRESSINGS) IMPLANT
DRAPE C-ARM 42X72 X-RAY (DRAPES) ×2 IMPLANT
DRAPE UTILITY 15X26 W/TAPE STR (DRAPE) ×4 IMPLANT
ELECT REM PT RETURN 9FT ADLT (ELECTROSURGICAL) ×2
ELECTRODE REM PT RTRN 9FT ADLT (ELECTROSURGICAL) ×1 IMPLANT
FILTER SMOKE EVAC LAPAROSHD (FILTER) IMPLANT
GLOVE BIO SURGEON STRL SZ7.5 (GLOVE) ×1 IMPLANT
GLOVE BIO SURGEON STRL SZ8 (GLOVE) ×2 IMPLANT
GLOVE BIOGEL PI IND STRL 7.0 (GLOVE) IMPLANT
GLOVE BIOGEL PI IND STRL 7.5 (GLOVE) IMPLANT
GLOVE BIOGEL PI IND STRL 8 (GLOVE) ×1 IMPLANT
GLOVE BIOGEL PI INDICATOR 7.0 (GLOVE) ×4
GLOVE BIOGEL PI INDICATOR 7.5 (GLOVE) ×1
GLOVE BIOGEL PI INDICATOR 8 (GLOVE) ×1
GLOVE ECLIPSE 6.5 STRL STRAW (GLOVE) ×1 IMPLANT
GLOVE SURG SS PI 7.0 STRL IVOR (GLOVE) ×2 IMPLANT
GOWN PREVENTION PLUS XLARGE (GOWN DISPOSABLE) ×2 IMPLANT
GOWN STRL NON-REIN LRG LVL3 (GOWN DISPOSABLE) ×7 IMPLANT
HEMOSTAT SNOW SURGICEL 2X4 (HEMOSTASIS) ×1 IMPLANT
KIT BASIN OR (CUSTOM PROCEDURE TRAY) ×2 IMPLANT
KIT ROOM TURNOVER OR (KITS) ×2 IMPLANT
NS IRRIG 1000ML POUR BTL (IV SOLUTION) ×2 IMPLANT
PAD ARMBOARD 7.5X6 YLW CONV (MISCELLANEOUS) ×2 IMPLANT
POUCH SPECIMEN RETRIEVAL 10MM (ENDOMECHANICALS) ×2 IMPLANT
SCISSORS LAP 5X35 DISP (ENDOMECHANICALS) ×1 IMPLANT
SET CHOLANGIOGRAPH 5 50 .035 (SET/KITS/TRAYS/PACK) ×2 IMPLANT
SET IRRIG TUBING LAPAROSCOPIC (IRRIGATION / IRRIGATOR) ×2 IMPLANT
SLEEVE ADV FIXATION 5X100MM (TROCAR) ×2 IMPLANT
SPECIMEN JAR SMALL (MISCELLANEOUS) ×2 IMPLANT
SUT VIC AB 4-0 PS2 27 (SUTURE) ×2 IMPLANT
TOWEL OR 17X24 6PK STRL BLUE (TOWEL DISPOSABLE) ×2 IMPLANT
TOWEL OR 17X26 10 PK STRL BLUE (TOWEL DISPOSABLE) ×2 IMPLANT
TRAY LAPAROSCOPIC (CUSTOM PROCEDURE TRAY) ×2 IMPLANT
TROCAR HASSON GELL 12X100 (TROCAR) ×2 IMPLANT
TROCAR Z-THREAD FIOS 11X100 BL (TROCAR) IMPLANT
TROCAR Z-THREAD FIOS 5X100MM (TROCAR) ×4 IMPLANT
WATER STERILE IRR 1000ML POUR (IV SOLUTION) IMPLANT

## 2011-08-28 NOTE — Anesthesia Preprocedure Evaluation (Addendum)
Anesthesia Evaluation  Patient identified by MRN, date of birth, ID band Patient awake    Reviewed: Allergy & Precautions, H&P , NPO status , Patient's Chart, lab work & pertinent test results  Airway Mallampati: I  Neck ROM: Full    Dental  (+) Dental Advisory Given and Teeth Intact   Pulmonary neg pulmonary ROS,  breath sounds clear to auscultation        Cardiovascular negative cardio ROS  Rhythm:Regular Rate:Normal     Neuro/Psych    GI/Hepatic negative GI ROS, Neg liver ROS,   Endo/Other  negative endocrine ROS  Renal/GU negative Renal ROS     Musculoskeletal   Abdominal   Peds  Hematology   Anesthesia Other Findings   Reproductive/Obstetrics                          Anesthesia Physical Anesthesia Plan  ASA: II and Emergent  Anesthesia Plan: General   Post-op Pain Management:    Induction: Intravenous  Airway Management Planned: Oral ETT  Additional Equipment:   Intra-op Plan:   Post-operative Plan: Extubation in OR  Informed Consent: I have reviewed the patients History and Physical, chart, labs and discussed the procedure including the risks, benefits and alternatives for the proposed anesthesia with the patient or authorized representative who has indicated his/her understanding and acceptance.     Plan Discussed with: Anesthesiologist and Surgeon  Anesthesia Plan Comments:         Anesthesia Quick Evaluation

## 2011-08-28 NOTE — ED Notes (Addendum)
Pt co pain left upper abdominal area that radiates to center.  Pt reports pain for 4 days and it getting worse.  Pt tender to palpatation in right upper quad.  Denies pain with respiration.  Pt denies using alcohol, states she uses ibuprophen occasionally.  Pt speaks some english.  Pt alert oriented X4

## 2011-08-28 NOTE — ED Notes (Addendum)
Pt is here with left mid abdominal pain for 4 days and reports bloating.  No diarrhea or constipation.  Pt reports nausea and no vomiting..  No vag discharge or bleeding  Or urinary symptoms.   LMP- last week.

## 2011-08-28 NOTE — Op Note (Signed)
08/28/2011  3:38 PM  PATIENT:  Kelly Serrano  36 y.o. female  PRE-OPERATIVE DIAGNOSIS:  Acute cholecystitis  POST-OPERATIVE DIAGNOSIS: Acute cholecystitis  PROCEDURE:  Procedure(s): LAPAROSCOPIC CHOLECYSTECTOMY WITH INTRAOPERATIVE CHOLANGIOGRAM  SURGEON:  Surgeon(s): Liz Malady, MD  PHYSICIAN ASSISTANT:   ASSISTANTS: none   ANESTHESIA:   local and general  EBL:  Total I/O In: 850 [I.V.:850] Out: 25 [Blood:25]  BLOOD ADMINISTERED:none  DRAINS: none   SPECIMEN:  Excision  DISPOSITION OF SPECIMEN:  PATHOLOGY  COUNTS:  YES  DICTATION: .Dragon DictationShe was admitted from the emergency department with acute cholecystitis. Patient was identified in the preop holding area. Informed consent was obtained from her by myself in Spanish. She received intravenous antibiotics. She was brought to the operating room and general endotracheal anesthesia was administered by the anesthesia staff. Her abdomen was prepped and draped in sterile fashion. Time out procedure was done. Infraumbilical region was infiltrated with quarter percent Marcaine with epinephrine. Infraumbilical incision was made. Subcutaneous tissues were dissected down revealing the anterior fascia. This was divided along the midline. Peritoneal cavity was entered under direct vision. 0 Vicryl pursestring suture was placed on the fascial opening. Hassan trocar was inserted. Abdomen was insufflated with carbon dioxide in standard fashion. Under direct vision a 5 mm epigastric and 2 5 mm lateral ports were placed in local was used at each port site.  Laparoscopic exploration revealed a distended and inflamed gallbladder. The dome was retracted superior medially. The infundibulum was retracted inferolaterally. Dissection began laterally and progressed medially and it is dissection continued until critical view was obtained between the cystic to the liver and the gallbladder. Once we had excellent visualization, a  clip was placed on the infundibular cystic duct junction. A small nick was made in the cystic duct. Cook cholangiogram catheter was inserted. Intraoperative cholangiogram demonstrated no common bile duct filling defects and good flow of contrast into the duodenum. There is some obscuration Secondary to CT contrast present in the colon. Classroom catheter was removed. 3 clips were placed proximally on the cystic duct and it was divided. Dissection revealed the cystic artery. It was clipped 3 times proximally once distally and divided. Gallbladder was taken off the liver bed with Bovie cautery. It was placed in the Endo Catch bag. And removed from the abdomen via the infraumbilical port site. The gallbladder bed was then cauterized to get excellent hemostasis. Abdomen was copiously irrigated. All bleeding was controlled.Clips were checked remain in good position. One piece of Surgicel snow was placed on the surface of the liver bed. The area was then dry. Irrigation fluid returned clear.Ports were removed under direct vision. Pneumoperitoneum was released. Infraumbilical fascia was closed by tying the 0 Vicryl pursestring suture with care not to trap any intra-abdominal contents. All 4 wounds were copiously irrigated and the skin of each was closed with running 4 Vicryl subcuticular stitch followed by Dermabond. All counts were correct. Patient tolerated procedure well without apparent Complications and the patient was taken recovery in stable condition.  PATIENT DISPOSITION:  PACU - hemodynamically stable.   Delay start of Pharmacological VTE agent (>24hrs) due to surgical blood loss or risk of bleeding:  no  Violeta Gelinas, MD, MPH, FACS Pager: [phone]  8/19/20133:38 PM

## 2011-08-28 NOTE — H&P (Signed)
GB appears inflamed with GS on CT.  GB is point tender.  Also has a small L kidney stone which may explain L pain.  Plan lap chole,IOC - I D/W the patient in Spanish. I discussed the procedure in detail. We discussed the risks and benefits of a laparoscopic cholecystectomy and possible cholangiogram including, but not limited to bleeding, infection, injury to surrounding structures such as the intestine or liver, bile leak, retained gallstones, need to convert to an open procedure, prolonged diarrhea, blood clots such as  DVT, common bile duct injury, anesthesia risks, and possible need for additional procedures.  The likelihood of improvement in symptoms and return to the patient's normal status is good. We discussed the typical post-operative recovery course. Patient examined and I agree with the assessment and plan  Violeta Gelinas, MD, MPH, FACS Pager: [phone]  08/28/2011 1:55 PM

## 2011-08-28 NOTE — H&P (Signed)
Kelly Serrano is an 36 y.o. female.   Chief Complaint: Abdominal pain and nausea HPI: The patient is a 36 year old Hispanic female who presents with 4 days Domino pain and nausea. It occurs and is worse after eating. She has not vomited. She has known about her gallstones since her pregnancy. She continues to have tenderness and pain primarily in the left upper quadrant. On physical exam she did have pain in her right upper quadrant with deep palpation by Dr. Janee Morn. White count on admission is 12,000. LFTs and lipase were normal. CT scan shows several calcified gallstones in the gallbladder neck measuring up to 2.5 cm. The gallbladder is dilated and there is minimum mild diffuse wall thickening and this is consistent with her exam and acute cholecystitis. There is no evidence of biliary ductal dilatation. There is diffuse panic steatosis but no focal liver masses identified. She also has a 2 mm nonobstructing calculus of the left kidney.  History reviewed. No pertinent past medical history. She was seen in Jan 2013, for back pain/sciatica No current medical treatment.  Past Surgical History  Procedure Date  . Cesarean section     No family history on file. Social History:  reports that she has never smoked. She does not have any smokeless tobacco history on file. She reports that she does not drink alcohol or use illicit drugs.  Allergies: No Known Allergies Prior to Admission medications   Medication Sig Start Date End Date Taking? Authorizing Provider  ibuprofen (ADVIL,MOTRIN) 200 MG tablet Take 200 mg by mouth every 6 (six) hours as needed. For pain   Yes Historical Provider, MD     (Not in a hospital admission)  Results for orders placed during the hospital encounter of 08/28/11 (from the past 48 hour(s))  URINALYSIS, ROUTINE W REFLEX MICROSCOPIC     Status: Abnormal   Collection Time   08/28/11  8:20 AM      Component Value Range Comment   Color, Urine YELLOW  YELLOW     APPearance CLEAR  CLEAR    Specific Gravity, Urine 1.028  1.005 - 1.030    pH 6.0  5.0 - 8.0    Glucose, UA NEGATIVE  NEGATIVE mg/dL    Hgb urine dipstick NEGATIVE  NEGATIVE    Bilirubin Urine NEGATIVE  NEGATIVE    Ketones, ur >80 (*) NEGATIVE mg/dL    Protein, ur 30 (*) NEGATIVE mg/dL    Urobilinogen, UA 1.0  0.0 - 1.0 mg/dL    Nitrite NEGATIVE  NEGATIVE    Leukocytes, UA NEGATIVE  NEGATIVE   URINE MICROSCOPIC-ADD ON     Status: Normal   Collection Time   08/28/11  8:20 AM      Component Value Range Comment   Squamous Epithelial / LPF RARE  RARE    WBC, UA 0-2  <3 WBC/hpf    RBC / HPF 0-2  <3 RBC/hpf    Bacteria, UA RARE  RARE    Urine-Other MUCOUS PRESENT     POCT PREGNANCY, URINE     Status: Normal   Collection Time   08/28/11  8:25 AM      Component Value Range Comment   Preg Test, Ur NEGATIVE  NEGATIVE   CBC WITH DIFFERENTIAL     Status: Abnormal   Collection Time   08/28/11  8:27 AM      Component Value Range Comment   WBC 12.0 (*) 4.0 - 10.5 K/uL    RBC 5.06  3.87 - 5.11  MIL/uL    Hemoglobin 15.2 (*) 12.0 - 15.0 g/dL    HCT 16.1  09.6 - 04.5 %    MCV 86.0  78.0 - 100.0 fL    MCH 30.0  26.0 - 34.0 pg    MCHC 34.9  30.0 - 36.0 g/dL    RDW 40.9  81.1 - 91.4 %    Platelets 301  150 - 400 K/uL    Neutrophils Relative 80 (*) 43 - 77 %    Neutro Abs 9.7 (*) 1.7 - 7.7 K/uL    Lymphocytes Relative 12  12 - 46 %    Lymphs Abs 1.5  0.7 - 4.0 K/uL    Monocytes Relative 6  3 - 12 %    Monocytes Absolute 0.7  0.1 - 1.0 K/uL    Eosinophils Relative 1  0 - 5 %    Eosinophils Absolute 0.1  0.0 - 0.7 K/uL    Basophils Relative 0  0 - 1 %    Basophils Absolute 0.0  0.0 - 0.1 K/uL   COMPREHENSIVE METABOLIC PANEL     Status: Abnormal   Collection Time   08/28/11  8:27 AM      Component Value Range Comment   Sodium 135  135 - 145 mEq/L    Potassium 3.9  3.5 - 5.1 mEq/L    Chloride 102  96 - 112 mEq/L    CO2 22  19 - 32 mEq/L    Glucose, Bld 124 (*) 70 - 99 mg/dL    BUN 10  6  - 23 mg/dL    Creatinine, Ser 7.82  0.50 - 1.10 mg/dL    Calcium 9.3  8.4 - 95.6 mg/dL    Total Protein 7.8  6.0 - 8.3 g/dL    Albumin 4.0  3.5 - 5.2 g/dL    AST 25  0 - 37 U/L    ALT 52 (*) 0 - 35 U/L    Alkaline Phosphatase 93  39 - 117 U/L    Total Bilirubin 0.4  0.3 - 1.2 mg/dL    GFR calc non Af Amer >90  >90 mL/min    GFR calc Af Amer >90  >90 mL/min   LIPASE, BLOOD     Status: Normal   Collection Time   08/28/11  8:27 AM      Component Value Range Comment   Lipase 18  11 - 59 U/L   POCT I-STAT, CHEM 8     Status: Abnormal   Collection Time   08/28/11  8:33 AM      Component Value Range Comment   Sodium 139  135 - 145 mEq/L    Potassium 3.9  3.5 - 5.1 mEq/L    Chloride 105  96 - 112 mEq/L    BUN 9  6 - 23 mg/dL    Creatinine, Ser 2.13  0.50 - 1.10 mg/dL    Glucose, Bld 086 (*) 70 - 99 mg/dL    Calcium, Ion 5.78  4.69 - 1.23 mmol/L    TCO2 21  0 - 100 mmol/L    Hemoglobin 16.0 (*) 12.0 - 15.0 g/dL    HCT 62.9 (*) 52.8 - 46.0 %    Ct Abdomen Pelvis W Contrast  08/28/2011  *RADIOLOGY REPORT*  Clinical Data: Left abdominal pain.  Nausea vomiting.  CT ABDOMEN AND PELVIS WITH CONTRAST  Technique:  Multidetector CT imaging of the abdomen and pelvis was performed following the standard protocol during bolus administration of  intravenous contrast.  Contrast: 80mL OMNIPAQUE IOHEXOL 300 MG/ML  SOLN  Comparison: None.  Findings: Several calcified gallstones are seen in the gallbladder neck measuring up to 2.5 cm.  The gallbladder is dilated and there is mild diffuse wall thickening, which are findings suspicious for acute cholecystitis.  No evidence of biliary ductal dilatation.  Diffuse hepatic steatosis noted but no focal liver masses are identified.  The pancreas, spleen, and adrenal glands and right kidney are normal appearance.  A 2 mm nonobstructing calculus in the upper pole of the left kidney is noted but the left kidney is otherwise normal in appearance.  No evidence of  hydronephrosis.  A small left ovarian corpus luteum is noted which is an expected finding in a reproductive age female.  No soft tissue masses or lymphadenopathy identified.  No evidence of bowel wall thickening or dilatation.  IMPRESSION:  1.  Findings consistent with acute cholecystitis. 2.  No evidence of biliary ductal dilatation. 3.  Hepatic steatosis. 4.  Nonobstructing left nephrolithiasis incidentally noted.   Original Report Authenticated By: Danae Orleans, M.D. ( 08/28/2011 11:33:25 )     Review of Systems  Constitutional: Positive for chills (some chills). Negative for weight loss, malaise/fatigue and diaphoresis.       Patient's primary language is [place].  I used translation app, and the patients English to do interview.  Dr. Janee Morn speaks Spanish.  He interviewed and examined patient.  HENT: Negative.   Eyes: Negative.   Respiratory: Negative.   Cardiovascular: Positive for leg swelling (some occasional swelling in legs.).  Gastrointestinal: Positive for nausea and abdominal pain (for the last 4 days, mostly on the left.). Negative for heartburn, vomiting, diarrhea, constipation, blood in stool and melena.  Genitourinary: Negative.  Negative for dysuria, urgency, frequency and hematuria.  Musculoskeletal: Negative.   Skin: Negative.   Neurological: Negative.  Negative for weakness.  Psychiatric/Behavioral: Negative.     Blood pressure 138/95, pulse 94, temperature 98.2 F (36.8 C), temperature source Oral, resp. rate 18, last menstrual period 08/21/2011, SpO2 98.00%. Physical Exam  Constitutional: She is oriented to person, place, and time. She appears well-developed and well-nourished. No distress.       Her daughter is with her and she is awaiting husband to get to hospital from his work.  HENT:  Head: Normocephalic and atraumatic.  Nose: Nose normal.  Eyes: Conjunctivae and EOM are normal. Pupils are equal, round, and reactive to light. Right eye exhibits no discharge.  Left eye exhibits no discharge. Scleral icterus is present.  Neck: Normal range of motion. Neck supple. No JVD present. No tracheal deviation present. No thyromegaly present.  Cardiovascular: Normal rate, regular rhythm and intact distal pulses.  Exam reveals no gallop.   Murmur heard. Respiratory: Effort normal. No stridor. No respiratory distress. She has no wheezes. She has no rales. She exhibits no tenderness.  GI: Soft. Bowel sounds are normal. She exhibits no distension and no mass. There is tenderness (Tender and complains of pain in LUQ.  she does have pain to deep palpation RUQ and the pain goes to her back.). There is guarding (a little on the left). There is no rebound.  Musculoskeletal: She exhibits no edema and no tenderness.  Lymphadenopathy:    She has no cervical adenopathy.  Neurological: She is alert and oriented to person, place, and time. She has normal reflexes. No cranial nerve deficit.  Skin: Skin is warm and dry. She is not diaphoretic.  Psychiatric: She has a normal  mood and affect. Her behavior is normal. Judgment and thought content normal.     Assessment/Plan 1. Cholecystitis with cholelithiasis. 2. Nonobstructing left nephrolithiasis; incidental  Plan: As been seen and evaluated by Dr. Janee Morn. It was his recommendation that the patient go forward with cholecystectomy and intraoperative cholangiogram. Dr. Janee Morn discussed the risks and benefits with the patient. He speaks Spanish, all questions were answered. [facility] physician assistant for Dr. Violeta Gelinas.  Lonza Shimabukuro 08/28/2011, 1:20 PM

## 2011-08-28 NOTE — Transfer of Care (Signed)
Immediate Anesthesia Transfer of Care Note  Patient: Kelly Serrano  Procedure(s) Performed: Procedure(s) (LRB): LAPAROSCOPIC CHOLECYSTECTOMY WITH INTRAOPERATIVE CHOLANGIOGRAM (N/A)  Patient Location: PACU  Anesthesia Type: General  Level of Consciousness: awake and alert   Airway & Oxygen Therapy: Patient Spontanous Breathing and Patient connected to nasal cannula oxygen  Post-op Assessment: Report given to PACU RN, Post -op Vital signs reviewed and stable and Patient moving all extremities X 4  Post vital signs: Reviewed and stable  Complications: No apparent anesthesia complications

## 2011-08-28 NOTE — Anesthesia Postprocedure Evaluation (Signed)
  Anesthesia Post-op Note  Patient: Kelly Serrano  Procedure(s) Performed: Procedure(s) (LRB): LAPAROSCOPIC CHOLECYSTECTOMY WITH INTRAOPERATIVE CHOLANGIOGRAM (N/A)  Patient Location: PACU  Anesthesia Type: General  Level of Consciousness: awake, alert  and oriented  Airway and Oxygen Therapy: Patient Spontanous Breathing  Post-op Pain: mild  Post-op Assessment: Post-op Vital signs reviewed and Patient's Cardiovascular Status Stable  Post-op Vital Signs: stable  Complications: No apparent anesthesia complications

## 2011-08-28 NOTE — Anesthesia Procedure Notes (Signed)
Procedure Name: Intubation Date/Time: 08/28/2011 2:29 PM Performed by: Elon Alas Pre-anesthesia Checklist: Timeout performed, Patient identified, Emergency Drugs available, Suction available and Patient being monitored Patient Re-evaluated:Patient Re-evaluated prior to inductionOxygen Delivery Method: Circle system utilized Preoxygenation: Pre-oxygenation with 100% oxygen Intubation Type: IV induction, Rapid sequence and Cricoid Pressure applied Laryngoscope Size: Mac and 3 Grade View: Grade I Tube type: Oral Tube size: 7.5 mm Number of attempts: 1 Airway Equipment and Method: Stylet Placement Confirmation: positive ETCO2,  ETT inserted through vocal cords under direct vision and breath sounds checked- equal and bilateral Secured at: 21 cm Tube secured with: Tape Dental Injury: Teeth and Oropharynx as per pre-operative assessment

## 2011-08-28 NOTE — Discharge Summary (Signed)
Physician Discharge Summary  Patient ID: Kelly Serrano MRN: [id number] DOB/AGE: 37-Sep-1977 37 y.o.  Admit date: 08/28/2011 Discharge date: 08/29/2011  Admission Diagnoses: Acute cholecystitis   Discharge Diagnoses: same Principal Problem:  *Cholelithiasis and acute cholecystitis without obstruction   PROCEDURES:  LAPAROSCOPIC CHOLECYSTECTOMY WITH INTRAOPERATIVE CHOLANGIOGRAM, 08/28/11 Dr. Denton Brick Course: he patient is a 36 year old Hispanic female who presents with 4 days Domino pain and nausea. It occurs and is worse after eating. She has not vomited. She has known about her gallstones since her pregnancy. She continues to have tenderness and pain primarily in the left upper quadrant. On physical exam she did have pain in her right upper quadrant with deep palpation by Dr. Janee Morn. White count on admission is 12,000. LFTs and lipase were normal. CT scan shows several calcified gallstones in the gallbladder neck measuring up to 2.5 cm. The gallbladder is dilated and there is minimum mild diffuse wall thickening and this is consistent with her exam and acute cholecystitis. There is no evidence of biliary ductal dilatation. There is diffuse panic steatosis but no focal liver masses identified. She also has a 2 mm nonobstructing calculus of the left kidney.  She was taken to the OR from the ED and underwent surgery.  She tolerated the procedure well and transferred, to the floor.  She has been mobilized and her diet advanced.  We plan discharge later today if she does well with PO's.  Follow up:  2 weeks Dr. Janee Morn  Condition on D/C:  Improved.  Disposition: 01-Home or Self Care   Medication List  As of 08/29/2011 11:26 AM   TAKE these medications         acetaminophen 325 MG tablet   Commonly known as: TYLENOL   Take 2 tablets (650 mg total) by mouth every 6 (six) hours as needed (or Temp > 100).      ibuprofen 200 MG tablet   Commonly known as: ADVIL,MOTRIN     You can take 2 or 3 tablets every 6 hours for pain as needed.      oxyCODONE-acetaminophen 5-325 MG per tablet   Commonly known as: PERCOCET/ROXICET   Take 1-2 tablets by mouth every 4 (four) hours as needed (pain).           Follow-up Information    Follow up with [facility], MD. Schedule an appointment as soon as possible for a visit in 2 weeks. (Make an appointment in 2-3 weeks.)    Contact information:   [address] [place] [address] [phone]       Follow up with Majel Homer, MD. (call and let him know you had surgery.)    Contact information:   [address] [place] [address] [phone]       Please follow up. (No lifting over 20 pounds for 4 weeks)          Signed: Sherrie George 08/29/2011, 11:26 AM

## 2011-08-28 NOTE — ED Notes (Signed)
Surgical consult in to see pt

## 2011-08-28 NOTE — ED Provider Notes (Signed)
Medical screening examination/treatment/procedure(s) were conducted as a shared visit with non-physician practitioner(s) and myself.  I personally evaluated the patient during the encounter   [name], MD 08/28/11 1644

## 2011-08-28 NOTE — ED Provider Notes (Signed)
History  This chart was scribed for Kelly Octave, MD by [name]. The patient was seen in room B16C/B16C. Patient's care was started at 0802.     CSN: [id number]  Arrival date & time 08/28/11  0802   First MD Initiated Contact with Patient 08/28/11 [phone]      Chief Complaint  Patient presents with  . Abdominal Pain    The history is provided by the patient. No language interpreter was used.   Kelly Serrano is a 36 y.o. female who presents to the Emergency Department complaining of constant, left, middle abdominal pain onset 4 days ago. Associated symptoms include nausea and bloating. No vomiting, diarrhea or constipation. There is no chest pain, SOB or pain with deep breathing. Patient denies blood in stool. Her bowel movements have been regular. She says that she occasionally takes 1 tablet of Ibuprofen as needed. Patient states that Ibuprofen has relieved similar pain in the past, but when she took it last night, there was no relief and pain persisted. Patient states that she has a history of colon irritation. Her surgical history includes Cesarean section. Patient has never smoked. Patient says that she does not drink alcohol.  Past Surgical History  Procedure Date  . Cesarean section    History  Substance Use Topics  . Smoking status: Never Smoker   . Smokeless tobacco: Not on file  . Alcohol Use: No    OB History    Grav Para Term Preterm Abortions TAB SAB Ect Mult Living                  Review of Systems A complete 10 system review of systems was obtained and all systems are negative except as noted in the HPI and PMH.   Allergies  Review of patient's allergies indicates no known allergies.  Home Medications   Current Outpatient Rx  Name Route Sig Dispense Refill  . ACETAMINOPHEN 325 MG PO TABS Oral Take 650 mg by mouth every 6 (six) hours as needed. For pain/fever    . NAPROXEN 500 MG PO TABS Oral Take 500 mg by mouth 2 (two) times daily.      Marland Kitchen PREDNISONE 50 MG PO TABS Oral Take 50 mg by mouth daily.      BP 138/95  Pulse 94  Temp 98.2 F (36.8 C) (Oral)  Resp 18  SpO2 98%  LMP 08/21/2011  Physical Exam  Constitutional: She is oriented to person, place, and time. She appears well-developed and well-nourished.  HENT:  Head: Normocephalic and atraumatic.  Eyes: EOM are normal. Pupils are equal, round, and reactive to light.  Cardiovascular: Normal rate and regular rhythm.   Pulmonary/Chest: Effort normal and breath sounds normal.  Abdominal: Soft. Bowel sounds are normal. There is tenderness in the left upper quadrant. There is no rebound, no guarding, no CVA tenderness and negative Murphy's sign.       Tender LUQ. Soft. No guarding. No rebound. Negative Murphy's sign. No CVA tenderness.  Neurological: She is alert and oriented to person, place, and time.  Skin: Skin is warm and dry.  Psychiatric: She has a normal mood and affect. Her behavior is normal.    ED Course  Procedures (including critical care time) DIAGNOSTIC STUDIES: Oxygen Saturation is 98% on room air, normal by my interpretation.    COORDINATION OF CARE: 8:25am- Patient informed of current plan for treatment and evaluation and agrees with plan at this time. Ordered x-ray of abdomen, CT of abdomen, metabolic  panel, lipase, CBC, urinalysis and pregnancy test. Will administer IV fluids, Zofran, Protonix and GI cocktail. Requested that patient be moved to CDU.   Results for orders placed during the hospital encounter of 08/28/11  URINALYSIS, ROUTINE W REFLEX MICROSCOPIC      Component Value Range   Color, Urine YELLOW  YELLOW   APPearance CLEAR  CLEAR   Specific Gravity, Urine 1.028  1.005 - 1.030   pH 6.0  5.0 - 8.0   Glucose, UA NEGATIVE  NEGATIVE mg/dL   Hgb urine dipstick NEGATIVE  NEGATIVE   Bilirubin Urine NEGATIVE  NEGATIVE   Ketones, ur >80 (*) NEGATIVE mg/dL   Protein, ur 30 (*) NEGATIVE mg/dL   Urobilinogen, UA 1.0  0.0 - 1.0 mg/dL    Nitrite NEGATIVE  NEGATIVE   Leukocytes, UA NEGATIVE  NEGATIVE  CBC WITH DIFFERENTIAL      Component Value Range   WBC 12.0 (*) 4.0 - 10.5 K/uL   RBC 5.06  3.87 - 5.11 MIL/uL   Hemoglobin 15.2 (*) 12.0 - 15.0 g/dL   HCT 16.1  09.6 - 04.5 %   MCV 86.0  78.0 - 100.0 fL   MCH 30.0  26.0 - 34.0 pg   MCHC 34.9  30.0 - 36.0 g/dL   RDW 40.9  81.1 - 91.4 %   Platelets 301  150 - 400 K/uL   Neutrophils Relative 80 (*) 43 - 77 %   Neutro Abs 9.7 (*) 1.7 - 7.7 K/uL   Lymphocytes Relative 12  12 - 46 %   Lymphs Abs 1.5  0.7 - 4.0 K/uL   Monocytes Relative 6  3 - 12 %   Monocytes Absolute 0.7  0.1 - 1.0 K/uL   Eosinophils Relative 1  0 - 5 %   Eosinophils Absolute 0.1  0.0 - 0.7 K/uL   Basophils Relative 0  0 - 1 %   Basophils Absolute 0.0  0.0 - 0.1 K/uL  COMPREHENSIVE METABOLIC PANEL      Component Value Range   Sodium 135  135 - 145 mEq/L   Potassium 3.9  3.5 - 5.1 mEq/L   Chloride 102  96 - 112 mEq/L   CO2 22  19 - 32 mEq/L   Glucose, Bld 124 (*) 70 - 99 mg/dL   BUN 10  6 - 23 mg/dL   Creatinine, Ser 7.82  0.50 - 1.10 mg/dL   Calcium 9.3  8.4 - 95.6 mg/dL   Total Protein 7.8  6.0 - 8.3 g/dL   Albumin 4.0  3.5 - 5.2 g/dL   AST 25  0 - 37 U/L   ALT 52 (*) 0 - 35 U/L   Alkaline Phosphatase 93  39 - 117 U/L   Total Bilirubin 0.4  0.3 - 1.2 mg/dL   GFR calc non Af Amer >90  >90 mL/min   GFR calc Af Amer >90  >90 mL/min  LIPASE, BLOOD      Component Value Range   Lipase 18  11 - 59 U/L  POCT PREGNANCY, URINE      Component Value Range   Preg Test, Ur NEGATIVE  NEGATIVE  POCT I-STAT, CHEM 8      Component Value Range   Sodium 139  135 - 145 mEq/L   Potassium 3.9  3.5 - 5.1 mEq/L   Chloride 105  96 - 112 mEq/L   BUN 9  6 - 23 mg/dL   Creatinine, Ser 2.13  0.50 - 1.10 mg/dL  Glucose, Bld 127 (*) 70 - 99 mg/dL   Calcium, Ion 1.61  0.96 - 1.23 mmol/L   TCO2 21  0 - 100 mmol/L   Hemoglobin 16.0 (*) 12.0 - 15.0 g/dL   HCT 04.5 (*) 40.9 - 81.1 %  URINE MICROSCOPIC-ADD ON       Component Value Range   Squamous Epithelial / LPF RARE  RARE   WBC, UA 0-2  <3 WBC/hpf   RBC / HPF 0-2  <3 RBC/hpf   Bacteria, UA RARE  RARE   Urine-Other MUCOUS PRESENT      Ct Abdomen Pelvis W Contrast  08/28/2011  *RADIOLOGY REPORT*  Clinical Data: Left abdominal pain.  Nausea vomiting.  CT ABDOMEN AND PELVIS WITH CONTRAST  Technique:  Multidetector CT imaging of the abdomen and pelvis was performed following the standard protocol during bolus administration of intravenous contrast.  Contrast: 80mL OMNIPAQUE IOHEXOL 300 MG/ML  SOLN  Comparison: None.  Findings: Several calcified gallstones are seen in the gallbladder neck measuring up to 2.5 cm.  The gallbladder is dilated and there is mild diffuse wall thickening, which are findings suspicious for acute cholecystitis.  No evidence of biliary ductal dilatation.  Diffuse hepatic steatosis noted but no focal liver masses are identified.  The pancreas, spleen, and adrenal glands and right kidney are normal appearance.  A 2 mm nonobstructing calculus in the upper pole of the left kidney is noted but the left kidney is otherwise normal in appearance.  No evidence of hydronephrosis.  A small left ovarian corpus luteum is noted which is an expected finding in a reproductive age female.  No soft tissue masses or lymphadenopathy identified.  No evidence of bowel wall thickening or dilatation.  IMPRESSION:  1.  Findings consistent with acute cholecystitis. 2.  No evidence of biliary ductal dilatation. 3.  Hepatic steatosis. 4.  Nonobstructing left nephrolithiasis incidentally noted.   Original Report Authenticated By: Danae Orleans, M.D. ( 08/28/2011 11:33:25 )      No diagnosis found.    MDM  Left sided upper abdominal pain with nausea x 4 days.  No vomiting or fever.  No urinary symptoms.  TTP LUQ with guarding.  No pain in RUQ.  Mild elevation of LFTs with leukocytosis.  CT pending in CDU.  I personally performed the services described in this  documentation, which was scribed in my presence.  The recorded information has been reviewed and considered.    Kelly Octave, MD 08/28/11 [phone]

## 2011-08-28 NOTE — ED Provider Notes (Signed)
12:00 PM Patient transferred from Pod A signout from Dr. Manus Gunning. This is a 36 year old female with left upper quadrant/left flank pain worsening over the course of 4 days pain is exacerbated with eating. She affirms nausea but denies any vomiting or fever. She is a white cell count of 12 with an elevated ALT of 54. CT shows dilation of the gallbladder with diffuse wall thickening consistent with cholecystitis. Consult will be made to surgery by Dr. Manus Gunning. Patient has mild pain with persistent nausea. She is alert and oriented x3 with heart sounds show regular rate and rhythm with no murmur, lungs are clear to auscultation bilaterally. She is tender to the left upper quadrant and left flank. Abdomen shows no peritoneal signs.    01:15 PM Consult from surgery service appreciated she will be taken to the OR for cholecystectomy and admitted to their service.    Wynetta Emery, PA-C 08/28/11 1319

## 2011-08-28 NOTE — ED Notes (Signed)
Ct aware pt has finished prep

## 2011-08-28 NOTE — Preoperative (Signed)
Beta Blockers   Reason not to administer Beta Blockers:Not Applicable 

## 2011-08-29 ENCOUNTER — Encounter (HOSPITAL_COMMUNITY): Payer: Self-pay | Admitting: General Practice

## 2011-08-29 MED ORDER — IBUPROFEN 200 MG PO TABS: ORAL_TABLET | ORAL | Status: DC

## 2011-08-29 MED ORDER — OXYCODONE-ACETAMINOPHEN 5-325 MG PO TABS: 1.0000 | ORAL_TABLET | ORAL | Status: AC | PRN

## 2011-08-29 MED ORDER — ACETAMINOPHEN 325 MG PO TABS: 650.0000 mg | ORAL_TABLET | Freq: Four times a day (QID) | ORAL | Status: AC | PRN

## 2011-08-29 NOTE — Progress Notes (Signed)
I spoke to the patient and her husband in Spanish explaining her care and D/C plan Patient examined and I agree with the assessment and plan  Violeta Gelinas, MD, MPH, FACS Pager: [phone]  08/29/2011 10:11 AM

## 2011-08-29 NOTE — Progress Notes (Signed)
Patient discharged to home with husband.  Discharge teaching completed including follow up care, signs and symptoms of infection, diet and medications.  Verbalizes understanding with no further questions.  Tolerated regular diet for lunch without complaints of nausea.  Discharged by wheelchair with husband. VSS.

## 2011-08-29 NOTE — Discharge Summary (Signed)
Ozzy Bohlken, MD, MPH, FACS Pager: [phone]  

## 2011-08-29 NOTE — Progress Notes (Signed)
1 Day Post-Op  Subjective: Says she felt SOB up to BR, and pain Right shoulder.    Objective: Vital signs in last 24 hours: Temp:  [97.5 F (36.4 C)-98.8 F (37.1 C)] 98.8 F (37.1 C) (08/20 0527) Pulse Rate:  [73-99] 97  (08/20 0527) Resp:  [11-21] 20  (08/20 0527) BP: (107-150)/(67-98) 123/67 mmHg (08/20 0527) SpO2:  [92 %-100 %] 100 % (08/20 0527) Last BM Date: 08/27/11  Afebrile, VSs, no labs, Sat 98% r/a HR83, BP 131/77   Intake/Output from previous day: 08/19 0701 - 08/20 0700 In: 2140.8 [P.O.:120; I.V.:2020.8] Out: 875 [Urine:850; Blood:25] Intake/Output this shift: Total I/O In: 120 [P.O.:120] Out: -   General appearance: alert, cooperative, no distress and still pretty hard getting in and out of bed. Resp: clear to auscultation bilaterally GI: tender/sore, but normal post op pain.  incisons look good, OK with clears, no nausea.  Lab Results:   Basename 08/28/11 0833 08/28/11 0827  WBC -- 12.0*  HGB 16.0* 15.2*  HCT 47.0* 43.5  PLT -- 301    BMET  Basename 08/28/11 0833 08/28/11 0827  NA 139 135  K 3.9 3.9  CL 105 102  CO2 -- 22  GLUCOSE 127* 124*  BUN 9 10  CREATININE 0.70 0.59  CALCIUM -- 9.3   PT/INR No results found for this basename: LABPROT:2,INR:2 in the last 72 hours   Lab 08/28/11 0827  AST 25  ALT 52*  ALKPHOS 93  BILITOT 0.4  PROT 7.8  ALBUMIN 4.0     Lipase     Component Value Date/Time   LIPASE 18 08/28/2011 0827     Studies/Results: Dg Cholangiogram Operative  08/28/2011  *RADIOLOGY REPORT*  Clinical Data:   Cholelithiasis.  INTRAOPERATIVE CHOLANGIOGRAM  Technique:  Cholangiographic images from the C-arm fluoroscopic device were submitted for interpretation post-operatively.  Please see the procedural report for the amount of contrast and the fluoroscopy time utilized.  Comparison:  CT 08/28/2011  Findings:  Contrast fills the common bile duct and refluxes into the intrahepatic biliary system.  There is minor filling of  the intrahepatic biliary system.  There are small filling defects near the cystic duct which are nonobstructive.  Overall, the common bile duct is not distended.  Contrast drains into the duodenum.  IMPRESSION: The extrahepatic biliary system is patent without dilatation. There are nonobstructive filling defects near the cystic duct which could be related to gas bubbles but cannot exclude sludge or nonobstructive stones.   Original Report Authenticated By: Richarda Overlie, M.D.    Ct Abdomen Pelvis W Contrast  08/28/2011  *RADIOLOGY REPORT*  Clinical Data: Left abdominal pain.  Nausea vomiting.  CT ABDOMEN AND PELVIS WITH CONTRAST  Technique:  Multidetector CT imaging of the abdomen and pelvis was performed following the standard protocol during bolus administration of intravenous contrast.  Contrast: 80mL OMNIPAQUE IOHEXOL 300 MG/ML  SOLN  Comparison: None.  Findings: Several calcified gallstones are seen in the gallbladder neck measuring up to 2.5 cm.  The gallbladder is dilated and there is mild diffuse wall thickening, which are findings suspicious for acute cholecystitis.  No evidence of biliary ductal dilatation.  Diffuse hepatic steatosis noted but no focal liver masses are identified.  The pancreas, spleen, and adrenal glands and right kidney are normal appearance.  A 2 mm nonobstructing calculus in the upper pole of the left kidney is noted but the left kidney is otherwise normal in appearance.  No evidence of hydronephrosis.  A small left ovarian corpus  luteum is noted which is an expected finding in a reproductive age female.  No soft tissue masses or lymphadenopathy identified.  No evidence of bowel wall thickening or dilatation.  IMPRESSION:  1.  Findings consistent with acute cholecystitis. 2.  No evidence of biliary ductal dilatation. 3.  Hepatic steatosis. 4.  Nonobstructing left nephrolithiasis incidentally noted.   Original Report Authenticated By: Danae Orleans, M.D. ( 08/28/2011 11:33:25 )      Medications:    . sodium chloride   Intravenous Once  . ciprofloxacin  400 mg Intravenous BID  . iohexol  20 mL Oral Q1 Hr x 2  . ondansetron (ZOFRAN) IV  4 mg Intravenous Once  . sodium chloride  1,000 mL Intravenous Once  . DISCONTD: ciprofloxacin  400 mg Intravenous Q12H  . DISCONTD: sodium chloride  1,000 mL Intravenous Once    Assessment/Plan Acute cholecystitis   Plan:  PO pain meds, mobilize, regular diet, home after lunch if she's OK.       LOS: 1 day    Kelly Serrano 08/29/2011

## 2012-04-04 ENCOUNTER — Ambulatory Visit: Payer: Self-pay | Admitting: Family Medicine

## 2013-03-29 ENCOUNTER — Emergency Department (HOSPITAL_COMMUNITY)
Admission: EM | Admit: 2013-03-29 | Discharge: 2013-03-29 | Disposition: A | Discharge status: Home / Self Care | Payer: Medicaid Other | Attending: Emergency Medicine | Admitting: Emergency Medicine

## 2013-03-29 ENCOUNTER — Encounter (HOSPITAL_COMMUNITY): Payer: Self-pay | Admitting: Emergency Medicine

## 2013-03-29 ENCOUNTER — Emergency Department (HOSPITAL_COMMUNITY): Payer: Medicaid Other

## 2013-03-29 DIAGNOSIS — R072 Precordial pain: Secondary | ICD-10-CM | POA: Insufficient documentation

## 2013-03-29 DIAGNOSIS — R69 Illness, unspecified: Secondary | ICD-10-CM

## 2013-03-29 DIAGNOSIS — H9209 Otalgia, unspecified ear: Secondary | ICD-10-CM | POA: Insufficient documentation

## 2013-03-29 DIAGNOSIS — Z8719 Personal history of other diseases of the digestive system: Secondary | ICD-10-CM | POA: Insufficient documentation

## 2013-03-29 DIAGNOSIS — J111 Influenza due to unidentified influenza virus with other respiratory manifestations: Secondary | ICD-10-CM | POA: Insufficient documentation

## 2013-03-29 DIAGNOSIS — M549 Dorsalgia, unspecified: Secondary | ICD-10-CM | POA: Insufficient documentation

## 2013-03-29 LAB — RAPID STREP SCREEN (MED CTR MEBANE ONLY): Streptococcus, Group A Screen (Direct): NEGATIVE

## 2013-03-29 MED ORDER — IBUPROFEN 800 MG PO TABS: 800.0000 mg | ORAL_TABLET | Freq: Three times a day (TID) | ORAL | Status: DC | PRN

## 2013-03-29 MED ORDER — PROMETHAZINE-DM 6.25-15 MG/5ML PO SYRP: 5.0000 mL | ORAL_SOLUTION | Freq: Four times a day (QID) | ORAL | Status: DC | PRN

## 2013-03-29 MED ORDER — GUAIFENESIN ER 1200 MG PO TB12: 1.0000 | ORAL_TABLET | Freq: Two times a day (BID) | ORAL | Status: DC

## 2013-03-29 NOTE — ED Notes (Signed)
Pt presents with sore throat, fever, a productive cough that is white in nature. Condition is acute in nature. Condition is made worse by nothing. Condition is made better by tylenol. Pt states that her daughter has similar signs and symptoms.

## 2013-03-29 NOTE — ED Provider Notes (Signed)
CSN: [id number]     Arrival date & time 03/29/13  0907 History  This chart was scribed for  Carlyle Dolly PA-C  working with Laray Anger, DO by Ashley Jacobs, ED scribe. This patient was seen in room TR10C/TR10C and the patient's care was started at 10:00 AM.   First MD Initiated Contact with Patient 03/29/13 [phone]     Chief Complaint  Patient presents with  . Sore Throat  . Fever     (Consider location/radiation/quality/duration/timing/severity/associated sxs/prior Treatment) Patient is a 38 y.o. female presenting with pharyngitis and fever. The history is provided by the patient and medical records. No language interpreter was used.  Sore Throat  Fever Associated symptoms: cough and sore throat    HPI Comments: Kelly Serrano is a 38 y.o. female who presents to the Emergency Department complaining of constant moderate sore throat and fever. Pt has the associated symptoms of fever and productive cough (white sputum).  She explains when she coughs she has medial sternum pain and back pain . Pt has right sided head and ear pain. Pt has tried tylenol which mildly resolves her symptoms. She had sick contact with her daughter who has similar symptoms.  Past Medical History  Diagnosis Date  . Cholelithiasis and acute cholecystitis without obstruction 08/28/2011   Past Surgical History  Procedure Laterality Date  . Cesarean section  2008  . Cholecystectomy  08/28/2011  . Cholecystectomy  08/28/2011    Procedure: LAPAROSCOPIC CHOLECYSTECTOMY WITH INTRAOPERATIVE CHOLANGIOGRAM;  Surgeon: Liz Malady, MD;  Location: [facility] OR;  Service: General;  Laterality: N/A;   History reviewed. No pertinent family history. History  Substance Use Topics  . Smoking status: Never Smoker   . Smokeless tobacco: Never Used  . Alcohol Use: No   OB History   Grav Para Term Preterm Abortions TAB SAB Ect Mult Living                 Review of Systems  Constitutional: Positive  for fever.  HENT: Positive for sore throat.   Respiratory: Positive for cough.   Musculoskeletal: Positive for back pain.  All other systems reviewed and are negative.      Allergies  Review of patient's allergies indicates no known allergies.  Home Medications   Current Outpatient Rx  Name  Route  Sig  Dispense  Refill  . acetaminophen (TYLENOL) 325 MG tablet   Oral   Take 325 mg by mouth every 6 (six) hours as needed for mild pain, fever or headache.          BP 152/94  Pulse 97  Temp(Src) 98.8 F (37.1 C) (Oral)  Resp 22  Wt 179 lb 6 oz (81.364 kg)  SpO2 98% Physical Exam  Nursing note and vitals reviewed. Constitutional: She is oriented to person, place, and time. She appears well-developed and well-nourished. No distress.  HENT:  Head: Normocephalic and atraumatic.  Mouth/Throat: Oropharynx is clear and moist.  Eyes: EOM are normal. Pupils are equal, round, and reactive to light.  Neck: Normal range of motion. Neck supple. No tracheal deviation present.  Cardiovascular: Normal rate, regular rhythm and normal heart sounds.  Exam reveals no gallop and no friction rub.   No murmur heard. Pulmonary/Chest: Effort normal and breath sounds normal. No respiratory distress.  Abdominal: Soft. She exhibits no distension.  Musculoskeletal: Normal range of motion.  Neurological: She is alert and oriented to person, place, and time.  Skin: Skin is warm and dry. No rash noted.  No erythema.  Psychiatric: She has a normal mood and affect. Her behavior is normal.    ED Course  Procedures (including critical care time) DIAGNOSTIC STUDIES: Oxygen Saturation is 98% on room air, normal by my interpretation.    COORDINATION OF CARE:  10:04 AM Discussed course of care with pt which includes back pain . Pt understands and agrees.    Labs Review Labs Reviewed  RAPID STREP SCREEN  CULTURE, GROUP A STREP   Imaging Review Dg Chest 2 View  03/29/2013   CLINICAL DATA:  sob   EXAM: CHEST  2 VIEW  COMPARISON:  None.  FINDINGS: The heart size and mediastinal contours are within normal limits. Both lungs are clear. The visualized skeletal structures are unremarkable.  IMPRESSION: No active cardiopulmonary disease.   Electronically Signed   By: Salome HolmesHector  Cooper M.D.   On: 03/29/2013 11:36   Patient be treated for influenza-like illness.  Told to return here as needed.  Told to increase her fluid intake, rest as much possible  I personally performed the services described in this documentation, which was scribed in my presence. The recorded information has been reviewed and is accurate.     Carlyle DollyChristopher W Janissa Bertram, PA-C 03/29/13 1149

## 2013-03-29 NOTE — Discharge Instructions (Signed)
Your x-ray was negative.  Your strep screen was also negative.  Increase your fluid intake, and rest as much as possible.  Return here as needed.  Followup with your primary care Dr.

## 2013-03-31 LAB — CULTURE, GROUP A STREP

## 2013-03-31 NOTE — ED Provider Notes (Signed)
Medical screening examination/treatment/procedure(s) were performed by non-physician practitioner and as supervising physician I was immediately available for consultation/collaboration.   EKG Interpretation None        Roark Rufo M Kairee Kozma, DO 03/31/13 2118 

## 2013-04-01 NOTE — Progress Notes (Signed)
ED Antimicrobial Stewardship Positive Culture Follow Up   Kelly FeltSanjuana Serrano is an 38 y.o. female who [facility] with a chief complaint of  Chief Complaint  Patient presents with  . Sore Throat  . Fever    Recent Results (from the past 720 hour(s))  RAPID STREP SCREEN     Status: None   Collection Time    03/29/13  9:30 AM      Result Value Ref Range Status   Streptococcus, Group A Screen (Direct) NEGATIVE  NEGATIVE Final   Comment: (NOTE)     A Rapid Antigen test may result negative if the antigen level in the     sample is below the detection level of this test. The FDA has not     cleared this test as a stand-alone test therefore the rapid antigen     negative result has reflexed to a Group A Strep culture.  CULTURE, GROUP A STREP     Status: None   Collection Time    03/29/13  9:30 AM      Result Value Ref Range Status   Specimen Description THROAT   Final   Special Requests NONE   Final   Culture     Final   Value: GROUP A STREP (S.PYOGENES) ISOLATED     Performed at [facility]   Report Status 03/31/2013 FINAL   Final     [x]  Patient discharged originally without antimicrobial agent and treatment is now indicated  New antibiotic prescription: amoxicillin 500mg  po BID x 10 days  ED Provider: Coral CeoJessica Palmer, PA-C   Mickeal SkinnerFrens, Anwen Cannedy John 04/01/2013, 9:34 AM Infectious Diseases Pharmacist Phone# [phone]

## 2013-04-02 ENCOUNTER — Telehealth (HOSPITAL_COMMUNITY): Payer: Self-pay | Admitting: *Deleted

## 2013-04-02 NOTE — ED Notes (Signed)
+   Strept Plan: Amoxicillin 500 mg po BID x 10 days written by Coral CeoJessica Palmer.

## 2013-04-06 NOTE — Telephone Encounter (Signed)
Unable to contact patient via phone. Sent letter. °

## 2013-04-22 ENCOUNTER — Telehealth (HOSPITAL_BASED_OUTPATIENT_CLINIC_OR_DEPARTMENT_OTHER): Payer: Self-pay

## 2013-04-22 NOTE — Telephone Encounter (Signed)
4/14 @ 11:17 Pt called after receiving letter.  She doesn't speak good [place] and would like an [employer] tradernterpreter.  11:46 Used [employer] pt called and informed of dx and need for tx.  [facility] [phone] and [facility] to [facility]

## 2013-06-26 ENCOUNTER — Ambulatory Visit: Payer: Self-pay

## 2015-02-14 ENCOUNTER — Emergency Department (HOSPITAL_COMMUNITY)
Admission: EM | Admit: 2015-02-14 | Discharge: 2015-02-14 | Disposition: A | Discharge status: Home / Self Care | Payer: Self-pay | Attending: Emergency Medicine | Admitting: Emergency Medicine

## 2015-02-14 ENCOUNTER — Encounter (HOSPITAL_COMMUNITY): Payer: Self-pay | Admitting: Nurse Practitioner

## 2015-02-14 DIAGNOSIS — R109 Unspecified abdominal pain: Secondary | ICD-10-CM | POA: Insufficient documentation

## 2015-02-14 DIAGNOSIS — I10 Essential (primary) hypertension: Secondary | ICD-10-CM | POA: Insufficient documentation

## 2015-02-14 DIAGNOSIS — R197 Diarrhea, unspecified: Secondary | ICD-10-CM | POA: Insufficient documentation

## 2015-02-14 DIAGNOSIS — Z3202 Encounter for pregnancy test, result negative: Secondary | ICD-10-CM | POA: Insufficient documentation

## 2015-02-14 DIAGNOSIS — Z79899 Other long term (current) drug therapy: Secondary | ICD-10-CM | POA: Insufficient documentation

## 2015-02-14 DIAGNOSIS — R112 Nausea with vomiting, unspecified: Secondary | ICD-10-CM | POA: Insufficient documentation

## 2015-02-14 DIAGNOSIS — Z8719 Personal history of other diseases of the digestive system: Secondary | ICD-10-CM | POA: Insufficient documentation

## 2015-02-14 HISTORY — DX: Essential (primary) hypertension: I10

## 2015-02-14 LAB — LIPASE, BLOOD: Lipase: 20 U/L (ref 11–51)

## 2015-02-14 LAB — COMPREHENSIVE METABOLIC PANEL
ALT: 66 U/L — ABNORMAL HIGH (ref 14–54)
AST: 40 U/L (ref 15–41)
Albumin: 3.7 g/dL (ref 3.5–5.0)
Alkaline Phosphatase: 102 U/L (ref 38–126)
Anion gap: 10 (ref 5–15)
BUN: 7 mg/dL (ref 6–20)
CO2: 18 mmol/L — ABNORMAL LOW (ref 22–32)
Calcium: 8.1 mg/dL — ABNORMAL LOW (ref 8.9–10.3)
Chloride: 105 mmol/L (ref 101–111)
Creatinine, Ser: 0.52 mg/dL (ref 0.44–1.00)
GFR calc Af Amer: >60 mL/min (ref >60)
GFR calc non Af Amer: >60 mL/min (ref >60)
Glucose, Bld: 116 mg/dL — ABNORMAL HIGH (ref 65–99)
Potassium: 3.3 mmol/L — ABNORMAL LOW (ref 3.5–5.1)
Sodium: 133 mmol/L — ABNORMAL LOW (ref 135–145)
Total Bilirubin: 0.8 mg/dL (ref 0.3–1.2)
Total Protein: 7.2 g/dL (ref 6.5–8.1)

## 2015-02-14 LAB — I-STAT BETA HCG BLOOD, ED (MC, WL, AP ONLY): I-stat hCG, quantitative: <5 m[IU]/mL (ref <5)

## 2015-02-14 LAB — CBC
HCT: 45.9 % (ref 36.0–46.0)
Hemoglobin: 15.8 g/dL — ABNORMAL HIGH (ref 12.0–15.0)
MCH: 29.8 pg (ref 26.0–34.0)
MCHC: 34.4 g/dL (ref 30.0–36.0)
MCV: 86.6 fL (ref 78.0–100.0)
Platelets: 272 10*3/uL (ref 150–400)
RBC: 5.3 MIL/uL — ABNORMAL HIGH (ref 3.87–5.11)
RDW: 12.7 % (ref 11.5–15.5)
WBC: 6.1 10*3/uL (ref 4.0–10.5)

## 2015-02-14 MED ORDER — SODIUM CHLORIDE 0.9 % IV BOLUS (SEPSIS)
1000.0000 mL | Freq: Once | INTRAVENOUS | Status: AC
  Administered 2015-02-14: 1000 mL via INTRAVENOUS

## 2015-02-14 MED ORDER — ONDANSETRON HCL 4 MG/2ML IJ SOLN
4.0000 mg | Freq: Once | INTRAMUSCULAR | Status: AC
  Administered 2015-02-14: 4 mg via INTRAVENOUS
  Filled 2015-02-14: qty 2

## 2015-02-14 MED ORDER — ONDANSETRON HCL 4 MG PO TABS: 4.0000 mg | ORAL_TABLET | Freq: Two times a day (BID) | ORAL | Status: DC | PRN

## 2015-02-14 NOTE — ED Notes (Addendum)
She c/o 2 day history vomiting, diarrhea abd pain. She reports she had a fever Friday night. States symptoms started after eating meal at a [employer]. She denies urinary symptoms

## 2015-02-14 NOTE — ED Provider Notes (Signed)
CSN: [id number]     Arrival date & time 02/14/15  1317 History   First MD Initiated Contact with Patient 02/14/15 1603     Chief Complaint  Patient presents with  . Emesis  . Diarrhea     (Consider location/radiation/quality/duration/timing/severity/associated sxs/prior Treatment) HPI  17 y f w pmh previous cholecystectomy, htn, no other sig pmh who comes in with n/v/d since Friday.  She has had multiple episodes of NBNB emesis as well as NB diarrhea.  This has been associated with crampy, waxing/waning abdominal pain.  On Friday she had fever which has resolved.  She has known sick contact of her child who had fever and emesis this past week.  She has no recent foreign travel.  Past Medical History  Diagnosis Date  . Cholelithiasis and acute cholecystitis without obstruction 08/28/2011  . Hypertension    Past Surgical History  Procedure Laterality Date  . Cesarean section  2008  . Cholecystectomy  08/28/2011  . Cholecystectomy  08/28/2011    Procedure: LAPAROSCOPIC CHOLECYSTECTOMY WITH INTRAOPERATIVE CHOLANGIOGRAM;  Surgeon: Liz Malady, MD;  Location: [facility] OR;  Service: General;  Laterality: N/A;   History reviewed. No pertinent family history. Social History  Substance Use Topics  . Smoking status: Never Smoker   . Smokeless tobacco: Never Used  . Alcohol Use: No   OB History    No data available     Review of Systems  Constitutional: Negative for fever and chills.  HENT: Negative for nosebleeds.   Eyes: Negative for visual disturbance.  Respiratory: Negative for cough and shortness of breath.   Cardiovascular: Negative for chest pain.  Gastrointestinal: Positive for nausea, vomiting, abdominal pain and diarrhea. Negative for constipation.  Genitourinary: Negative for dysuria.  Skin: Negative for rash.  Neurological: Negative for weakness.  All other systems reviewed and are negative.     Allergies  Review of patient's allergies indicates no known  allergies.  Home Medications   Prior to Admission medications   Medication Sig Start Date End Date Taking? Authorizing Provider  acetaminophen (TYLENOL) 325 MG tablet Take 325 mg by mouth every 6 (six) hours as needed for mild pain, fever or headache.   Yes Historical Provider, MD  ibuprofen (ADVIL,MOTRIN) 800 MG tablet Take 1 tablet (800 mg total) by mouth every 8 (eight) hours as needed. 03/29/13  Yes Christopher Lawyer, PA-C  lisinopril-hydrochlorothiazide (PRINZIDE,ZESTORETIC) 20-12.5 MG tablet Take 1 tablet by mouth daily. 12/16/14  Yes Historical Provider, MD  Guaifenesin 1200 MG TB12 Take 1 tablet (1,200 mg total) by mouth 2 (two) times daily. 03/29/13   Christopher Lawyer, PA-C  ondansetron (ZOFRAN) 4 MG tablet Take 1 tablet (4 mg total) by mouth every 12 (twelve) hours as needed for nausea or vomiting. 02/14/15   Silas Flood, MD  promethazine-dextromethorphan (PROMETHAZINE-DM) 6.25-15 MG/5ML syrup Take 5 mLs by mouth 4 (four) times daily as needed for cough. 03/29/13   Christopher Lawyer, PA-C   BP 143/95 mmHg  Pulse 99  Temp(Src) 98.1 F (36.7 C) (Oral)  Resp 20  SpO2 96%  LMP 02/01/2015 Physical Exam  Constitutional: She is oriented to person, place, and time. No distress.  HENT:  Head: Normocephalic and atraumatic.  Eyes: EOM are normal. Pupils are equal, round, and reactive to light.  Neck: Normal range of motion. Neck supple.  Cardiovascular: Normal rate and intact distal pulses.   Pulmonary/Chest: No respiratory distress.  Abdominal: Soft. She exhibits no distension. There is tenderness (mild epigastric ttp). There is no guarding.  Musculoskeletal: Normal range of motion.  Neurological: She is alert and oriented to person, place, and time.  Skin: No rash noted. She is not diaphoretic.  Psychiatric: She has a normal mood and affect.    ED Course  Procedures (including critical care time) Labs Review Labs Reviewed  COMPREHENSIVE METABOLIC PANEL - Abnormal; Notable for  the following:    Sodium 133 (*)    Potassium 3.3 (*)    CO2 18 (*)    Glucose, Bld 116 (*)    Calcium 8.1 (*)    ALT 66 (*)    All other components within normal limits  CBC - Abnormal; Notable for the following:    RBC 5.30 (*)    Hemoglobin 15.8 (*)    All other components within normal limits  LIPASE, BLOOD  URINALYSIS, ROUTINE W REFLEX MICROSCOPIC (NOT AT [facility])  I-STAT BETA HCG BLOOD, ED ([facility], [facility], [facility] ONLY)    Imaging Review No results found. I have personally reviewed and evaluated these images and lab results as part of my medical decision-making.   EKG Interpretation None      MDM   Final diagnoses:  Diarrhea, unspecified type    60 y f w pmh previous cholecystectomy, htn, no other sig pmh who comes in with n/v/d since Friday.  Exam as above.  She appears non-toxic.  Abdomen mildly tender. Concern for viral gastroenteritis vs. Food poisoning.  Doubt dangerous bacterial cause of diarrhea given no sig abdominal ttp, no blood in the dirrhea, no recent travel.  Cbc/cmp/lipase unremarkable. Will give fluid bolus and zofran,.  At this time, feel safe for d/c.  Will d/c with pcp f/u Will give zofran to go home with  I have discussed the results, Dx and Tx plan with the pt. They expressed understanding and agree with the plan and were told to return to ED with any worsening of condition or concern.    Disposition: Discharge  Condition: Good  New Prescriptions   ONDANSETRON (ZOFRAN) 4 MG TABLET    Take 1 tablet (4 mg total) by mouth every 12 (twelve) hours as needed for nausea or vomiting.    Follow Up: [facility] EMERGENCY DEPARTMENT [address] [id number] mc [place] [address] [phone]  As needed   Pt seen in conjunction with Dr. Nelda Bucks, MD 02/14/15 1735  Blane Ohara, MD 02/15/15 [phone]

## 2015-02-14 NOTE — Discharge Instructions (Signed)
Diarrea   (Diarrhea)  La diarrea consiste en evacuaciones intestinales frecuentes, blandas o acuosas. Puede hacerlo sentir débil y deshidratado. La deshidratación puede hacer que se sienta cansado, sediento, tener la boca seca y que haya disminución de orina, que a menudo es de color amarillo oscuro. La diarrea es un signo de otro problema, generalmente una infección que no durará mucho tiempo. En la mayoría de los casos, la diarrea dura típicamente 2 a 3 días. Sin embargo, puede durar más tiempo si se trata de un signo de algo más serio. Es importante tratar la diarrea como lo indique su médico para disminuir o prevenir futuros episodios de diarrea.   CAUSAS   Algunas causas comunes son:   · Infecciones gastrointestinales causadas por virus, bacterias o parásitos.  · Intoxicación alimentaria o alergias a los alimentos.  · Ciertos medicamentos, como los antibióticos, quimioterapia y laxantes.  · Edulcorantes artificiales y fructosa.  · Los trastornos digestivos.  INSTRUCCIONES PARA EL CUIDADO EN EL HOGAR   · Asegure una adecuada ingesta de líquidos (hidratación). Evite los líquidos que contengan azúcares simples o las bebidas deportivas, los jugos de frutas, los productos derivados de la leche entera y las gaseosas. Si bebe la cantidad suficiente de líquidos, la orina debe ser clara o amarillo pálido. Una solución de rehidratación oral se puede comprar en las farmacias, en las tiendas minoristas y por Internet. Se puede preparar una solución de rehidratación oral casera con los siguientes ingredientes:  ¨ ?-? cucharadita de sal.  ¨ ¾ cucharadita de bicarbonato.  ¨ ? de cucharadita de sal sustituta que contenga cloruro de potasio.  ¨ 1 ? cucharada de azúcar.  ¨ 1l (34 onzas) de agua.  · Ciertos alimentos y bebidas pueden aumentar la velocidad a la que el alimento se mueve a través del tracto gastrointestinal (GI). Estos alimentos y bebidas deben evitarse e incluyen:  ¨ Bebidas alcohólicas y con cafeína.  ¨ Alimentos  ricos en fibra, como frutas y verduras, nueces, semillas, panes y cereales integrales.  ¨ Alimentos y bebidas endulzados con alcoholes de azúcar, tales como xilitol, sorbitol, y manitol.  · Algunos alimentos pueden ser bien tolerados y puede ayudar a espesar las heces, incluyendo:  ¨ Alimentos con almidón, como arroz, pan, pasta, cereales bajos en azúcar, avena, sémola de maíz, papas al horno, galletas y panecillos.  ¨ Bananas.  ¨ Puré de manzana.  · Agregue alimentos ricos en probióticos a la dieta del niño para ayudar a aumentar las bacterias saludables en el tracto gastrointestinal, como el yogur y productos lácteos fermentados.  · Lávese bien las manos después de cada episodio de diarrea.  · Tome sólo medicamentos de venta libre o recetados, según las indicaciones del médico.  · Tome un baño caliente para ayudar a disminuir ardor o dolor por los episodios frecuentes de diarrea.  SOLICITE ATENCIÓN MÉDICA DE INMEDIATO SI:   · No puede retener los líquidos.  · Tiene vómitos persistentes.  · Observa sangre en la materia fecal, o las heces son negras y de aspecto alquitranado.  · No hay emisión de orina durante 6 a 8 horas o elimina una pequeña cantidad de orina muy oscura.  · Tiene dolor abdominal que aumenta o se localiza.  · Está muy mareado o se desvanece.  · Sufre un dolor intenso de cabeza.  · La diarrea empeora o no mejora.  · Tiene fiebre o síntomas que persisten durante más de 2 o 3 días.  · Tiene fiebre y los síntomas empeoran de   manera súbita.  ASEGÚRESE DE QUE:   · Comprende estas instrucciones.  · Controlará su enfermedad.  · Solicitará ayuda de inmediato si no mejora o si empeora.     Esta información no tiene como fin reemplazar el consejo del médico. Asegúrese de hacerle al médico cualquier pregunta que tenga.     Document Released: 12/26/2004 Document Revised: 12/13/2011  Elsevier Interactive Patient Education ©2016 [employer]

## 2018-04-05 ENCOUNTER — Other Ambulatory Visit: Payer: Self-pay

## 2018-04-05 ENCOUNTER — Encounter (HOSPITAL_COMMUNITY): Payer: Self-pay | Admitting: Emergency Medicine

## 2018-04-05 ENCOUNTER — Emergency Department (HOSPITAL_COMMUNITY): Payer: Self-pay

## 2018-04-05 ENCOUNTER — Emergency Department (HOSPITAL_COMMUNITY)
Admission: EM | Admit: 2018-04-05 | Discharge: 2018-04-05 | Disposition: A | Discharge status: Home / Self Care | Payer: Self-pay | Attending: Emergency Medicine | Admitting: Emergency Medicine

## 2018-04-05 DIAGNOSIS — R109 Unspecified abdominal pain: Secondary | ICD-10-CM

## 2018-04-05 DIAGNOSIS — N201 Calculus of ureter: Secondary | ICD-10-CM

## 2018-04-05 DIAGNOSIS — N132 Hydronephrosis with renal and ureteral calculous obstruction: Secondary | ICD-10-CM | POA: Insufficient documentation

## 2018-04-05 DIAGNOSIS — N200 Calculus of kidney: Secondary | ICD-10-CM | POA: Insufficient documentation

## 2018-04-05 DIAGNOSIS — K760 Fatty (change of) liver, not elsewhere classified: Secondary | ICD-10-CM | POA: Insufficient documentation

## 2018-04-05 LAB — CBC WITH DIFFERENTIAL/PLATELET
Abs Immature Granulocytes: 0.03 10*3/uL (ref 0.00–0.07)
Basophils Absolute: 0 10*3/uL (ref 0.0–0.1)
Basophils Relative: 0 %
Eosinophils Absolute: 0 10*3/uL (ref 0.0–0.5)
Eosinophils Relative: 0 %
HCT: 41 % (ref 36.0–46.0)
Hemoglobin: 13 g/dL (ref 12.0–15.0)
Immature Granulocytes: 0 %
Lymphocytes Relative: 11 %
Lymphs Abs: 1.3 10*3/uL (ref 0.7–4.0)
MCH: 27.5 pg (ref 26.0–34.0)
MCHC: 31.7 g/dL (ref 30.0–36.0)
MCV: 86.9 fL (ref 80.0–100.0)
Monocytes Absolute: 0.7 10*3/uL (ref 0.1–1.0)
Monocytes Relative: 6 %
Neutro Abs: 9.1 10*3/uL — ABNORMAL HIGH (ref 1.7–7.7)
Neutrophils Relative %: 83 %
Platelets: 296 10*3/uL (ref 150–400)
RBC: 4.72 MIL/uL (ref 3.87–5.11)
RDW: 12.6 % (ref 11.5–15.5)
WBC: 11.2 10*3/uL — ABNORMAL HIGH (ref 4.0–10.5)
nRBC: 0 % (ref 0.0–0.2)

## 2018-04-05 LAB — URINALYSIS, ROUTINE W REFLEX MICROSCOPIC
Bilirubin Urine: NEGATIVE
Glucose, UA: NEGATIVE mg/dL
Ketones, ur: NEGATIVE mg/dL
Leukocytes,Ua: NEGATIVE
Nitrite: NEGATIVE
Protein, ur: NEGATIVE mg/dL
RBC / HPF: >50 RBC/hpf — ABNORMAL HIGH (ref 0–5)
Specific Gravity, Urine: 1.013 (ref 1.005–1.030)
pH: 5 (ref 5.0–8.0)

## 2018-04-05 LAB — BASIC METABOLIC PANEL
Anion gap: 9 (ref 5–15)
BUN: 15 mg/dL (ref 6–20)
CO2: 21 mmol/L — ABNORMAL LOW (ref 22–32)
Calcium: 8.9 mg/dL (ref 8.9–10.3)
Chloride: 104 mmol/L (ref 98–111)
Creatinine, Ser: 0.78 mg/dL (ref 0.44–1.00)
GFR calc Af Amer: >60 mL/min (ref >60)
GFR calc non Af Amer: >60 mL/min (ref >60)
Glucose, Bld: 146 mg/dL — ABNORMAL HIGH (ref 70–99)
Potassium: 4.1 mmol/L (ref 3.5–5.1)
Sodium: 134 mmol/L — ABNORMAL LOW (ref 135–145)

## 2018-04-05 LAB — I-STAT BETA HCG BLOOD, ED (MC, WL, AP ONLY): I-stat hCG, quantitative: <5 m[IU]/mL (ref <5)

## 2018-04-05 MED ORDER — ONDANSETRON 4 MG PO TBDP: 4.0000 mg | ORAL_TABLET | Freq: Three times a day (TID) | ORAL | 0 refills | Status: DC | PRN

## 2018-04-05 MED ORDER — KETOROLAC TROMETHAMINE 30 MG/ML IJ SOLN
30.0000 mg | Freq: Once | INTRAMUSCULAR | Status: AC
  Administered 2018-04-05: 30 mg via INTRAVENOUS
  Filled 2018-04-05: qty 1

## 2018-04-05 MED ORDER — MORPHINE SULFATE (PF) 4 MG/ML IV SOLN
4.0000 mg | Freq: Once | INTRAVENOUS | Status: AC
  Administered 2018-04-05: 4 mg via INTRAVENOUS
  Filled 2018-04-05: qty 1

## 2018-04-05 MED ORDER — TAMSULOSIN HCL 0.4 MG PO CAPS: 0.4000 mg | ORAL_CAPSULE | Freq: Every day | ORAL | 0 refills | Status: DC

## 2018-04-05 MED ORDER — OXYCODONE-ACETAMINOPHEN 5-325 MG PO TABS: 1.0000 | ORAL_TABLET | Freq: Four times a day (QID) | ORAL | 0 refills | Status: DC | PRN

## 2018-04-05 MED ORDER — ONDANSETRON HCL 4 MG/2ML IJ SOLN
4.0000 mg | Freq: Once | INTRAMUSCULAR | Status: AC
  Administered 2018-04-05: 4 mg via INTRAVENOUS
  Filled 2018-04-05: qty 2

## 2018-04-05 NOTE — ED Notes (Signed)
Patient transported to CT scan . 

## 2018-04-05 NOTE — ED Provider Notes (Signed)
[facility] EMERGENCY DEPARTMENT Provider Note   CSN: [id number] Arrival date & time: 04/05/18  0043    History   Chief Complaint Chief Complaint  Patient presents with  . Flank Pain    HPI Kelly Serrano is a 43 y.o. female.     43 y/o female presenting to the ED for L flank pain with onset at 1900 tonight. Pain is nonradiating and has been constant, but waxes and wanes in severity.  She reports increased nausea with worsening pain as well as subjective chills.  She believe her symptoms without relief prior to arrival.  When her pain is more severe, she has the urge to void and defecate.  She has not noted any dysuria, but does indicate a more pinkish hue to her urine.  No melena, hematochezia, fevers, vomiting.  Abdominal surgical history significant for cholecystectomy and cesarean section.  No prior history of a kidney stone.  The history is provided by the patient. No language interpreter was used.  Flank Pain     Past Medical History:  Diagnosis Date  . Cholelithiasis and acute cholecystitis without obstruction 08/28/2011  . Hypertension     Patient Active Problem List   Diagnosis Date Noted  . Cholelithiasis and acute cholecystitis without obstruction 08/28/2011  . 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 HEARING DEFICIT 05/31/2009  . DIZZINESS 05/19/2009  . OBESITY, UNSPECIFIED 09/06/2007    Past Surgical History:  Procedure Laterality Date  . CESAREAN SECTION  2008  . CHOLECYSTECTOMY  08/28/2011  . CHOLECYSTECTOMY  08/28/2011   Procedure: LAPAROSCOPIC CHOLECYSTECTOMY WITH INTRAOPERATIVE CHOLANGIOGRAM;  Surgeon: Liz Malady, MD;  Location: [facility] OR;  Service: General;  Laterality: N/A;     OB History   No obstetric history on file.      Home Medications    Prior to Admission medications   Medication Sig Start Date End Date Taking? Authorizing Provider  acetaminophen (TYLENOL) 325 MG tablet Take 325 mg by mouth every 6  (six) hours as needed for mild pain, fever or headache.    [provider]  Guaifenesin 1200 MG TB12 Take 1 tablet (1,200 mg total) by mouth 2 (two) times daily. 03/29/13   Lawyer, Cristal Deer, PA-C  ibuprofen (ADVIL,MOTRIN) 800 MG tablet Take 1 tablet (800 mg total) by mouth every 8 (eight) hours as needed. 03/29/13   Lawyer, Cristal Deer, PA-C  lisinopril-hydrochlorothiazide (PRINZIDE,ZESTORETIC) 20-12.5 MG tablet Take 1 tablet by mouth daily. 12/16/14   [provider]  ondansetron (ZOFRAN ODT) 4 MG disintegrating tablet Take 1 tablet (4 mg total) by mouth every 8 (eight) hours as needed for nausea or vomiting. 04/05/18   Antony Madura, PA-C  ondansetron (ZOFRAN) 4 MG tablet Take 1 tablet (4 mg total) by mouth every 12 (twelve) hours as needed for nausea or vomiting. 02/14/15   Silas Flood, MD  oxyCODONE-acetaminophen (PERCOCET/ROXICET) 5-325 MG tablet Take 1-2 tablets by mouth every 6 (six) hours as needed for severe pain. 04/05/18   Antony Madura, PA-C  promethazine-dextromethorphan (PROMETHAZINE-DM) 6.25-15 MG/5ML syrup Take 5 mLs by mouth 4 (four) times daily as needed for cough. 03/29/13   Lawyer, Cristal Deer, PA-C  tamsulosin (FLOMAX) 0.4 MG CAPS capsule Take 1 capsule (0.4 mg total) by mouth daily. 04/05/18   Antony Madura, PA-C    Family History No family history on file.  Social History Social History   Tobacco Use  . Smoking status: Never Smoker  . Smokeless tobacco: Never Used  Substance Use Topics  . Alcohol  use: No  . Drug use: No     Allergies   Patient has no known allergies.   Review of Systems Review of Systems  Genitourinary: Positive for flank pain.  Ten systems reviewed and are negative for acute change, except as noted in the HPI.    Physical Exam Updated Vital Signs BP 127/71 (BP Location: Left Arm)   Pulse 79   Temp 98.8 F (37.1 C) (Oral)   Resp 16   SpO2 98%   Physical Exam Vitals signs and nursing note reviewed.  Constitutional:       General: She is not in acute distress.    Appearance: She is well-developed. She is not diaphoretic.     Comments: Nontoxic appearing and in NAD  HENT:     Head: Normocephalic and atraumatic.  Eyes:     General: No scleral icterus.    Conjunctiva/sclera: Conjunctivae normal.  Neck:     Musculoskeletal: Normal range of motion.  Cardiovascular:     Rate and Rhythm: Normal rate and regular rhythm.     Pulses: Normal pulses.  Pulmonary:     Effort: Pulmonary effort is normal. No respiratory distress.     Breath sounds: No stridor. No wheezing.     Comments: Respirations even and unlabored Abdominal:     Palpations: There is no mass.     Tenderness: There is abdominal tenderness. There is no guarding.     Hernia: No hernia is present.     Comments: TTP to the LUQ and left flank. Abdomen soft, obese. No peritoneal signs.  Musculoskeletal: Normal range of motion.  Skin:    General: Skin is warm and dry.     Coloration: Skin is not pale.     Findings: No erythema or rash.  Neurological:     Mental Status: She is alert and oriented to person, place, and time.     Coordination: Coordination normal.  Psychiatric:        Behavior: Behavior normal.      ED Treatments / Results  Labs (all labs ordered are listed, but only abnormal results are displayed) Labs Reviewed  URINALYSIS, ROUTINE W REFLEX MICROSCOPIC - Abnormal; Notable for the following components:      Result Value   Hgb urine dipstick LARGE (*)    RBC / HPF >50 (*)    Bacteria, UA RARE (*)    All other components within normal limits  CBC WITH DIFFERENTIAL/PLATELET - Abnormal; Notable for the following components:   WBC 11.2 (*)    Neutro Abs 9.1 (*)    All other components within normal limits  BASIC METABOLIC PANEL - Abnormal; Notable for the following components:   Sodium 134 (*)    CO2 21 (*)    Glucose, Bld 146 (*)    All other components within normal limits  I-STAT BETA HCG BLOOD, ED ([facility], [facility], [facility] ONLY)     EKG None  Radiology Ct Renal Stone Study  Result Date: 04/05/2018 CLINICAL DATA:  Initial evaluation for acute left flank pain. EXAM: CT ABDOMEN AND PELVIS WITHOUT CONTRAST TECHNIQUE: Multidetector CT imaging of the abdomen and pelvis was performed following the standard protocol without IV contrast. COMPARISON:  None available. FINDINGS: Lower chest: Scattered subsegmental atelectatic changes present within the right greater than left lung bases. Visualized lungs are otherwise clear. Hepatobiliary: Diffuse hypoattenuation liver consistent with steatosis. Liver otherwise unremarkable. Gallbladder surgically absent. No biliary dilatation. Pancreas: Pancreas within normal limits. Spleen: Spleen within normal limits. Adrenals/Urinary  Tract: Adrenal glands are normal. There is a 4 mm obstructive stone within the distal left ureter with secondary mild left hydroureteronephrosis. Associated mild left perinephric and periureteral fat stranding. No other radiopaque calculi seen along the course of the left renal collecting system or within the left kidney. On the right, there is a punctate 2 mm nonobstructive stone present within the lower pole. No hydronephrosis. No radiopaque calculi seen along the course of the right renal collecting system. No right-sided hydroureter. Partially distended bladder within normal limits. No layering stones within the bladder lumen. Stomach/Bowel: Stomach within normal limits. No evidence for bowel obstruction. Appendix within normal limits. No acute inflammatory changes seen about the bowels. Vascular/Lymphatic: Intra-abdominal aorta of normal caliber. No adenopathy. Reproductive: Uterus and ovaries within normal limits. Other: No free air or fluid. Musculoskeletal: No acute osseous finding. No discrete lytic or blastic osseous lesions. Degenerative spondylolysis noted at L5-S1. Mild sclerosis noted about the SI joints bilaterally, left greater than right. IMPRESSION: 1. 4 mm  obstructive stone within the distal left ureter with secondary mild left hydroureteronephrosis. 2. Additional punctate nonobstructive right renal nephrolithiasis. 3. Hepatic steatosis. Electronically Signed   By: Rise Mu M.D.   On: 04/05/2018 04:14    Procedures Procedures (including critical care time)  Medications Ordered in ED Medications  morphine 4 MG/ML injection 4 mg (4 mg Intravenous Given 04/05/18 0230)  ondansetron (ZOFRAN) injection 4 mg (4 mg Intravenous Given 04/05/18 0231)  ketorolac (TORADOL) 30 MG/ML injection 30 mg (30 mg Intravenous Given 04/05/18 0316)    4:30 AM Patient with no pain at this time. Appears comfortable. Verbalizes understanding of CT results.   Initial Impression / Assessment and Plan / ED Course  I have reviewed the triage vital signs and the nursing notes.  Pertinent labs & imaging results that were available during my care of the patient were reviewed by me and considered in my medical decision making (see chart for details).        Patient has been diagnosed with a kidney stone via CT. There is no evidence of significant hydronephrosis, serum creatine WNL, vitals sign stable and the pt does not have irratractable vomiting. She will be discharged home with pain medications & has been advised to follow up with Urology if pain persists beyond 1 week. Return precautions discussed and provided. Patient discharged in stable condition with no unaddressed concerns.   Final Clinical Impressions(s) / ED Diagnoses   Final diagnoses:  Ureterolithiasis  Left flank pain    ED Discharge Orders         Ordered    oxyCODONE-acetaminophen (PERCOCET/ROXICET) 5-325 MG tablet  Every 6 hours PRN     04/05/18 0428    ondansetron (ZOFRAN ODT) 4 MG disintegrating tablet  Every 8 hours PRN     04/05/18 0428    tamsulosin (FLOMAX) 0.4 MG CAPS capsule  Daily     04/05/18 0428           Antony Madura, PA-C 04/05/18 0440    Derwood Kaplan, MD  04/05/18 [phone]

## 2018-04-05 NOTE — ED Triage Notes (Signed)
Patient with left flank pain with nausea.  She states that she has chills with her pain when it is at its worst.  She states this started around 1900 last evening.  Patient she does have some urinary complaints from the pain.

## 2018-04-05 NOTE — Discharge Instructions (Signed)
Your CT scan today showed evidence of a kidney stone.  Your kidney stone should pass within the next week.  For management of ongoing pain, we recommend the use of 600 mg ibuprofen every 6 hours.  You have been given a prescription for Percocet to use for severe pain control.  Take Zofran as needed for nausea and Flomax as prescribed to promote stone movement.  Remain well-hydrated by drinking plenty of fluids.  If your pain remains persistent, we advise follow-up with urology.  You may return to the emergency department if symptoms persist or worsen.  ------  Su tomografa computarizada de hoy mostr evidencia de un clculo renal.  Su clculos renales debe pasar dentro de la prxima semana.  Para el manejo del dolor continuo, recomendamos el uso de 600 mg de ibuprofeno cada 6 horas.  Se le ha dado una receta para Percocet para usar para el control del Psychologist, prison and probation services.  Tome Zofran segn sea necesario para las nuseas y Flomax segn lo prescrito para promover el movimiento de piedra.  Permanezca bien hidratado bebiendo muchos lquidos.  Si su dolor sigue siendo persistente, le recomendamos un seguimiento con Personal assistant.  Usted puede regresar al departamento de emergencias si los sntomas persisten o empeoran.

## 2018-08-22 ENCOUNTER — Other Ambulatory Visit (HOSPITAL_COMMUNITY): Payer: Self-pay | Admitting: *Deleted

## 2018-08-22 DIAGNOSIS — N644 Mastodynia: Secondary | ICD-10-CM

## 2018-10-22 ENCOUNTER — Ambulatory Visit (HOSPITAL_COMMUNITY)
Admission: RE | Admit: 2018-10-22 | Discharge: 2018-10-22 | Disposition: A | Discharge status: Home / Self Care | Payer: Self-pay | Source: Ambulatory Visit | Attending: Obstetrics and Gynecology | Admitting: Obstetrics and Gynecology

## 2018-10-22 ENCOUNTER — Ambulatory Visit
Admission: RE | Admit: 2018-10-22 | Discharge: 2018-10-22 | Disposition: A | Discharge status: Home / Self Care | Payer: No Typology Code available for payment source | Source: Ambulatory Visit | Attending: Obstetrics and Gynecology | Admitting: Obstetrics and Gynecology

## 2018-10-22 ENCOUNTER — Other Ambulatory Visit: Payer: Self-pay

## 2018-10-22 ENCOUNTER — Encounter (HOSPITAL_COMMUNITY): Payer: Self-pay

## 2018-10-22 DIAGNOSIS — Z1239 Encounter for other screening for malignant neoplasm of breast: Secondary | ICD-10-CM

## 2018-10-22 DIAGNOSIS — N644 Mastodynia: Secondary | ICD-10-CM

## 2018-10-22 HISTORY — DX: Mastodynia: N64.4

## 2018-10-22 HISTORY — DX: Type 2 diabetes mellitus without complications: E11.9

## 2018-10-22 NOTE — Patient Instructions (Addendum)
Explained breast self awareness with Kelly Serrano. Patient did not need a Pap smear today due to last Pap smear was in 2019 per patient. Let her know [facility] will cover Pap smears every 3 years unless has a history of abnormal Pap smears. Referred patient to the [facility] for a diagnostic mammogram and possible right breast ultrasound. Appointment scheduled for Tuesday, October 22, 2018 at 1450. Patient aware of appointment and will be there.  Kelly Serrano verbalized understanding.  Pranav Lince, Arvil Chaco, RN 2:13 PM

## 2018-10-22 NOTE — Progress Notes (Signed)
Complaints of right outer breast pain x 7 months that comes and goes. Patient rates the pain at a 6 out of 10. Patient complained of right breast lump x 4 months.  Pap Smear: Pap smear not completed today. Last Pap smear was in 2019 at [facility] and normal per patient. Per patient has no history of an abnormal Pap smear. Last Pap smear result is not in Epic. Previous Pap smear result from 01/20/2008 is in [facility].  Physical exam: Breasts Right breast slightly larger than left breast that per patient has noticed a change over the past year. No skin abnormalities bilateral breasts. No nipple retraction bilateral breasts. No nipple discharge bilateral breasts. No lymphadenopathy. No lumps palpated bilateral breasts. Unable to palpate a lump in patients area of concern. Complaints of right outer breast pain on exam. Referred patient to the [facility] for a diagnostic mammogram and possible right breast ultrasound. Appointment scheduled for Tuesday, October 22, 2018 at 1450.        Pelvic/Bimanual No Pap smear completed today since last Pap smear was in 2019 per patient. Pap smear not indicated per BCCCP guidelines.   Smoking History: Patient has never smoked.  Patient Navigation: Patient education provided. Access to services provided for patient through [facility] program. Spanish interpreter provided.   Breast and Cervical Cancer Risk Assessment: Patient has no family history of breast cancer, known genetic mutations, or radiation treatment to the chest before age 16. Patient has no history of cervical dysplasia, immunocompromised, or DES exposure in-utero.  Risk Assessment    Risk Scores      10/22/2018   Last edited by: Loletta Parish, RN   5-year risk: 0.8 %   Lifetime risk: 10.2 %         Used Spanish interpreter Zenda Alpers from [employer].

## 2018-11-13 ENCOUNTER — Other Ambulatory Visit: Payer: Self-pay

## 2018-11-13 ENCOUNTER — Other Ambulatory Visit: Payer: Self-pay | Admitting: Obstetrics and Gynecology

## 2018-11-13 ENCOUNTER — Inpatient Hospital Stay: Payer: Self-pay | Attending: Obstetrics and Gynecology | Admitting: *Deleted

## 2018-11-13 VITALS — BP 137/73 | Temp 98.1°F | Ht 64.0 in | Wt 180.0 lb

## 2018-11-13 DIAGNOSIS — Z0000 Encounter for general adult medical examination without abnormal findings: Secondary | ICD-10-CM

## 2018-11-13 NOTE — Progress Notes (Signed)
[facility] initial screening   interpreter- Rudene Anda, [employer]   Clinical Measurement:  Height: 64 in Weight: 180 lb  Blood Pressure: 144/71  Blood Pressure #2: 138/80 Fasting Labs Drawn Today, will review with patient when they result.   Medical History:  Patient states that she does not have a history of high cholesterol.Patient states that she does have a history of high blood pressure and diabetes.  Medications:  Patient states that she does not take medication to lower cholesterol. Patient states that she does take medication to lower blood pressure and blood sugar. Patient does not take an aspirin a day to help prevent a heart attack or stroke. During the past 7 days patient has taken prescribed medication to lower blood pressure and blood sugar on 7 days.   Blood pressure, self measurement: Patient states that she does not measure blood pressure from home because she does not have equipment to do so.   Nutrition: Patient states that on average she eats 1 cup of fruit and 1 cup of vegetables per day. Patient states that she does eat fish at least 2 times per week. Patient eats about half servings of whole grains. Patient drinks less than 36 ounces of beverages with added sugar weekly. Patient is currently watching sodium or salt intake. In the past 7 days patient has not had any drinks containing alcohol. On average patient does not drink any drinks containing alcohol.      Physical activity:  Patient states that she gets 90 minutes of moderate and 0 minutes of vigorous physical activity each week.  Smoking status:  Patient states that she has never smoked tobacco.   Quality of life:  Over the past 2 weeks patient states that she has not had any days where she has little interest or pleasure in doing things and 0 days where she has felt down, depressed or hopeless.    Risk reduction and counseling:  Health Coaching: Encouraged patient to try and add in more fruits and vegetables into  daily diet. Explained to the patient that the daily recommendation is for 2 cups of fruit and 3 cups of vegetables. Encouraged patient to also continue watching salt intake because of hx of high blood pressure. As well as watching the amount of sweets and carbs that she consumes due to her hx of diabetes. Patient currently walks 3 days a week for 30 minutes. Encouraged patient to try and walk for 20 minutes daily.      Navigation:  I will notify patient of lab results.  Patient is aware of 2 more health coaching sessions and a follow up. Will refer patient to Internal Medicine for elevated BP once labs are back.  Time: 25 minutes

## 2018-11-14 LAB — LIPID PANEL W/O CHOL/HDL RATIO
Cholesterol, Total: 152 mg/dL (ref 100–199)
HDL: 44 mg/dL (ref >39)
LDL Chol Calc (NIH): 85 mg/dL (ref 0–99)
Triglycerides: 130 mg/dL (ref 0–149)
VLDL Cholesterol Cal: 23 mg/dL (ref 5–40)

## 2018-11-14 LAB — HGB A1C W/O EAG: Hgb A1c MFr Bld: 7.2 % — ABNORMAL HIGH (ref 4.8–5.6)

## 2018-11-14 LAB — GLUCOSE, RANDOM: Glucose: 137 mg/dL — ABNORMAL HIGH (ref 65–99)

## 2018-11-18 ENCOUNTER — Telehealth: Payer: Self-pay

## 2018-11-18 NOTE — Telephone Encounter (Signed)
Health coaching 2   interpreter- Rudene Anda, [place]- 152 cholesterol , 85 LDL cholesterol , 130 triglycerides , 44 HDL cholesterol , 7.2 hemoglobin A1C , 137 mean plasma glucose  Patient understands and is aware of her lab results.   Goals-  Discussed lab results with patient and answered any questions that patient had regarding results.  Goals- Reduce the amount of sweets consumed. Reduce the amount of carbs that is consumed. Add in more exercise to get at least 20 minutes of walking daily.    Navigation:  Patient is aware of 1 more health coaching sessions and a follow up. Patient is scheduled for follow-up with Internal Medicine on November 12th @ 2:45 pm  Time- 11 minutes

## 2018-11-18 NOTE — Progress Notes (Signed)
Patient has been referred to Internal Medicine for FU with PCP.

## 2018-11-21 ENCOUNTER — Encounter: Payer: Self-pay | Admitting: Internal Medicine

## 2018-11-21 ENCOUNTER — Other Ambulatory Visit: Payer: Self-pay

## 2018-11-21 ENCOUNTER — Ambulatory Visit (INDEPENDENT_AMBULATORY_CARE_PROVIDER_SITE_OTHER): Payer: Self-pay | Admitting: Internal Medicine

## 2018-11-21 DIAGNOSIS — Z7984 Long term (current) use of oral hypoglycemic drugs: Secondary | ICD-10-CM

## 2018-11-21 DIAGNOSIS — E119 Type 2 diabetes mellitus without complications: Secondary | ICD-10-CM

## 2018-11-21 NOTE — Patient Instructions (Signed)
Kelly Serrano,   Su diabetes esta muy bien controlada. Continue tomando metformin 500 mg dos veces al dias. Es importante que trabaje en su dieta, evite carbohidratos (tortillas, frijoles, papas, arroz, bebidas azucaradas, jugos).   Puede tomar vitamina D de la siguiente marca (Vitafusion, nature made, finest).   Haga una cita de seguimiento con nosotros en 3 meses para repetir el A1c.   - Dra. Frederico Hamman

## 2018-11-21 NOTE — Progress Notes (Signed)
   CC: T2DM follow-up  HPI:  Kelly Serrano is a 43 y.o. year-old female with PMH listed below who presents to clinic for T2DM follow-up. Please see problem based assessment and plan for further details.   Past Medical History:  Diagnosis Date  . Cholelithiasis and acute cholecystitis without obstruction 08/28/2011  . Diabetes ([place])   . Hypertension    Review of Systems:   Review of Systems  Constitutional: Negative for chills, fever, malaise/fatigue and weight loss.  Genitourinary: Negative for frequency and urgency.  Endo/Heme/Allergies: Negative for polydipsia.     Physical Exam:  Vitals:   11/21/18 1520  BP: 125/70  Pulse: 80  Temp: 97.9 F (36.6 C)  TempSrc: Oral  SpO2: 100%  Weight: 182 lb 4.8 oz (82.7 kg)  Height: 5\' 4"  (1.626 m)    General: well appearing female in no acute distress Cardiac: regular rate and rhythm, nl S1/S2, no murmurs, rubs or gallops   Pulm: CTAB, no wheezes or crackles, no increased work of breathing on room air  Ext: warm and well perfused, no peripheral edema   Assessment & Plan:   See Encounters Tab for problem based charting.  Patient discussed with Dr. Rebeca Alert

## 2018-11-22 ENCOUNTER — Encounter: Payer: Self-pay | Admitting: Internal Medicine

## 2018-11-22 DIAGNOSIS — E119 Type 2 diabetes mellitus without complications: Secondary | ICD-10-CM | POA: Insufficient documentation

## 2018-11-22 NOTE — Progress Notes (Signed)
Internal Medicine Clinic Attending  Case discussed with Dr. Santos-Sanchez at the time of the visit.  We reviewed the resident's history and exam and pertinent patient test results.  I agree with the assessment, diagnosis, and plan of care documented in the resident's note.  Alexander Raines, M.D., Ph.D.  

## 2018-11-22 NOTE — Assessment & Plan Note (Signed)
Patient presents for further evaluation after she was found to have an A1c of 7.2 two days ago through the [employer].  She follows up with [facility].  They have been prescribing metformin 500 mg BID for her for the past 2 years which she has been compliant with.  We discussed lifestyle modifications versus increasing metformin to maximum dose but she prefers to try lifestyle modifications at this time. Provided information about low carbohydrate diet. Follow up in 3-6 months.

## 2018-12-13 ENCOUNTER — Other Ambulatory Visit: Payer: Self-pay | Admitting: Internal Medicine

## 2018-12-13 DIAGNOSIS — Z20822 Contact with and (suspected) exposure to covid-19: Secondary | ICD-10-CM

## 2018-12-14 LAB — NOVEL CORONAVIRUS, NAA: SARS-CoV-2, NAA: NOT DETECTED

## 2018-12-19 ENCOUNTER — Encounter: Payer: Self-pay | Admitting: Internal Medicine

## 2019-02-19 ENCOUNTER — Encounter: Payer: Self-pay | Admitting: Internal Medicine

## 2019-02-19 ENCOUNTER — Ambulatory Visit: Payer: Self-pay | Admitting: Internal Medicine

## 2019-02-19 ENCOUNTER — Other Ambulatory Visit: Payer: Self-pay

## 2019-02-19 VITALS — BP 125/73 | HR 82 | Temp 98.4°F | Ht 64.0 in | Wt 182.1 lb

## 2019-02-19 DIAGNOSIS — Z6831 Body mass index (BMI) 31.0-31.9, adult: Secondary | ICD-10-CM

## 2019-02-19 DIAGNOSIS — E669 Obesity, unspecified: Secondary | ICD-10-CM

## 2019-02-19 DIAGNOSIS — E119 Type 2 diabetes mellitus without complications: Secondary | ICD-10-CM

## 2019-02-19 DIAGNOSIS — Z7984 Long term (current) use of oral hypoglycemic drugs: Secondary | ICD-10-CM

## 2019-02-19 DIAGNOSIS — H538 Other visual disturbances: Secondary | ICD-10-CM

## 2019-02-19 DIAGNOSIS — I10 Essential (primary) hypertension: Secondary | ICD-10-CM

## 2019-02-19 DIAGNOSIS — Z79899 Other long term (current) drug therapy: Secondary | ICD-10-CM

## 2019-02-19 LAB — POCT GLYCOSYLATED HEMOGLOBIN (HGB A1C): Hemoglobin A1C: 6.7 % — AB (ref 4.0–5.6)

## 2019-02-19 LAB — GLUCOSE, CAPILLARY: Glucose-Capillary: 168 mg/dL — ABNORMAL HIGH (ref 70–99)

## 2019-02-19 MED ORDER — METFORMIN HCL 500 MG PO TABS: 500.0000 mg | ORAL_TABLET | Freq: Two times a day (BID) | ORAL | 3 refills | Status: DC

## 2019-02-19 MED ORDER — LISINOPRIL-HYDROCHLOROTHIAZIDE 20-12.5 MG PO TABS: 1.0000 | ORAL_TABLET | Freq: Every day | ORAL | 3 refills | Status: DC

## 2019-02-19 NOTE — Patient Instructions (Signed)
It was nice seeing you today! Thank you for choosing [facility] for your Primary Care.    - Refilled medications   - Your diabetes is very well controlled! Keep up the good work.

## 2019-02-20 ENCOUNTER — Encounter: Payer: Self-pay | Admitting: Internal Medicine

## 2019-02-21 ENCOUNTER — Encounter: Payer: Self-pay | Admitting: Internal Medicine

## 2019-02-21 DIAGNOSIS — H538 Other visual disturbances: Secondary | ICD-10-CM | POA: Insufficient documentation

## 2019-02-21 MED ORDER — METFORMIN HCL 500 MG PO TABS: 500.0000 mg | ORAL_TABLET | Freq: Two times a day (BID) | ORAL | 3 refills | Status: AC

## 2019-02-21 MED ORDER — LISINOPRIL-HYDROCHLOROTHIAZIDE 20-12.5 MG PO TABS: 1.0000 | ORAL_TABLET | Freq: Every day | ORAL | 3 refills | Status: AC

## 2019-02-21 NOTE — Assessment & Plan Note (Signed)
Well controlled with current regimen.   Plan:  - Refilled Lisinopril-HCTZ

## 2019-02-21 NOTE — Progress Notes (Signed)
   CC: blurry vision  HPI:  Kelly Serrano is a 44 y.o. with a PMHx of T2DM, HTN who presents to the clinic for blurry vision.   Please see the Encounters tab for problem-based Assessment & Plan regarding status of patient's chronic conditions.  Past Medical History:  Diagnosis Date  . Breast pain, right 10/22/2018  . Cholelithiasis and acute cholecystitis without obstruction 08/28/2011  . 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 Diabetes ([facility])   . DIZZINESS 05/19/2009   Qualifier: Diagnosis of  By: Burnadette Pop  MD, Trisha Mangle    . HEARING DEFICIT 05/31/2009   Qualifier: Diagnosis of  By: Burnadette Pop  MD, Trisha Mangle    . Hypertension    Review of Systems: ROS  Physical Exam:  Vitals:   02/19/19 1555  BP: 125/73  Pulse: 82  Temp: 98.4 F (36.9 C)  TempSrc: Oral  SpO2: 100%  Weight: 182 lb 1.3 oz (82.6 kg)  Height: 5\' 4"  (1.626 m)   Physical Exam Vitals and nursing note reviewed.  Constitutional:      General: She is not in acute distress.    Appearance: She is obese.  Eyes:     Extraocular Movements: Extraocular movements intact.     Conjunctiva/sclera: Conjunctivae normal.     Pupils: Pupils are equal, round, and reactive to light.  Pulmonary:     Effort: Pulmonary effort is normal. No respiratory distress.  Skin:    General: Skin is warm and dry.  Neurological:     General: No focal deficit present.     Mental Status: She is alert and oriented to person, place, and time. Mental status is at baseline.     Gait: Gait normal.  Psychiatric:        Mood and Affect: Mood normal.        Behavior: Behavior normal.    Assessment & Plan:   See Encounters Tab for problem based charting.  Patient discussed with Dr. 

## 2019-02-21 NOTE — Assessment & Plan Note (Signed)
Kelly Serrano states she has been having blurry vision for at least 6 months. It initially did not bother her significant but has worsened gradually. At first, it only occurred before and after eating but is now persistent throughout the day. She has difficulty seeing objects at distance and close up. Reader glasses have helped significantly with close up vision. She endorses occasionally seeing spots in her vision that has also been occurring for the past 6 months.   On examination, no obvious pathology on fundoscopic examination but without dilating pupils, unlikely to be reliable. Patient would benefit from ophthalmological evaluation given her hx of HTN and T2DM. Differential dx includes myopia vs retinopathy vs macular degeneration.  Unfortunately, there are no available referrals for patients with orange card. Discussed with Mamie who will contact various offices around [place] to see if there are any openings though.   Plan: - Referral to Opthalmology.

## 2019-02-21 NOTE — Assessment & Plan Note (Signed)
A1c improved today from 7.2 to 6.7%. She endorses tolerating Metformin well with only minimal nausea. Denies abdominal pain or diarrhea. Discussed lifestyle changes to minimize carbohydrates, as well as importance of weight loss.   Plan:  - Metformin 500 mg BID  - Lifestyle changes

## 2019-02-21 NOTE — Assessment & Plan Note (Signed)
Patient has been working hard on losing weight. She participates in exercise activity with her whole family and has a support system in place.   Plan:  - Continue with daily exercise, calorie reduction

## 2019-02-24 NOTE — Progress Notes (Signed)
Internal Medicine Clinic Attending  Case discussed with Dr. Basaraba at the time of the visit.  We reviewed the resident's history and exam and pertinent patient test results.  I agree with the assessment, diagnosis, and plan of care documented in the resident's note.  Candise Crabtree, M.D., Ph.D.  

## 2019-03-05 NOTE — Addendum Note (Signed)
Addended by: Neomia Dear on: 03/05/2019 02:46 PM   Modules accepted: Orders

## 2019-05-20 ENCOUNTER — Telehealth: Payer: Self-pay

## 2019-05-20 NOTE — Telephone Encounter (Signed)
Health Coaching 3  interpreter- Natale Lay, [employer]   Goals- Patient has been working on making diet and exercise changes over the past several months. Patient stated that she has been walking/running 5 days a week for 45 minutes at a time. Patient states that she has eliminated sodas and potatoes from diet. She has reduced the amount of bread that she consumes.    New goal- Encouraged patient to continue with the great work she has been putting in over the past several months. Patient states that she will continue to work on her diet and exercise in the hopes of continued weight loss and improvements in health.   Barrier to reaching goal- NA   Strategies to overcome- NA   Navigation:  Patient is aware of a follow up session. Patient is scheduled for follow-up visit on Monday, June 14th @ 2:30 pm.   Time- 10 minutes

## 2019-06-23 ENCOUNTER — Ambulatory Visit: Payer: No Typology Code available for payment source

## 2019-07-02 ENCOUNTER — Ambulatory Visit: Payer: No Typology Code available for payment source

## 2019-10-17 ENCOUNTER — Other Ambulatory Visit: Payer: Self-pay | Admitting: Family Medicine

## 2019-10-17 DIAGNOSIS — Z1231 Encounter for screening mammogram for malignant neoplasm of breast: Secondary | ICD-10-CM

## 2019-10-28 ENCOUNTER — Other Ambulatory Visit: Payer: Self-pay

## 2019-10-28 ENCOUNTER — Ambulatory Visit
Admission: RE | Admit: 2019-10-28 | Discharge: 2019-10-28 | Disposition: A | Discharge status: Home / Self Care | Payer: No Typology Code available for payment source | Source: Ambulatory Visit | Attending: Family Medicine | Admitting: Family Medicine

## 2019-10-28 DIAGNOSIS — Z1231 Encounter for screening mammogram for malignant neoplasm of breast: Secondary | ICD-10-CM

## 2020-01-07 ENCOUNTER — Encounter: Payer: Self-pay | Admitting: Internal Medicine

## 2020-01-07 NOTE — Telephone Encounter (Signed)
Spoke with Kelly Serrano regarding her question about COVID test. She mentions that she was able to find a pharmacy that offer COVID tests and she has been tested. She is awaiting the results. She mentions that she is vaccinated and she is having fevers, nausea, 1 episode of vomiting but denies significant respiratory distress. Advised to treat symptomatically and come to ED if developing respiratory distress. [name] expressed understanding.

## 2020-07-13 ENCOUNTER — Encounter: Payer: Self-pay | Admitting: *Deleted

## 2022-01-27 ENCOUNTER — Other Ambulatory Visit: Payer: Self-pay | Admitting: Family

## 2022-01-27 ENCOUNTER — Ambulatory Visit
Admission: RE | Admit: 2022-01-27 | Discharge: 2022-01-27 | Disposition: A | Discharge status: Home / Self Care | Payer: No Typology Code available for payment source | Source: Ambulatory Visit | Attending: Family | Admitting: Family

## 2022-01-27 DIAGNOSIS — M25561 Pain in right knee: Secondary | ICD-10-CM

## 2022-02-06 IMAGING — MG DIGITAL SCREENING BILAT W/ TOMO W/ CAD
8 series · 8 of 24 positions shown · non-contrast
Comparison: Previous exam(s).

CLINICAL DATA: Screening.

EXAM:
DIGITAL SCREENING BILATERAL MAMMOGRAM WITH TOMO AND CAD

[R CC synth-2D]
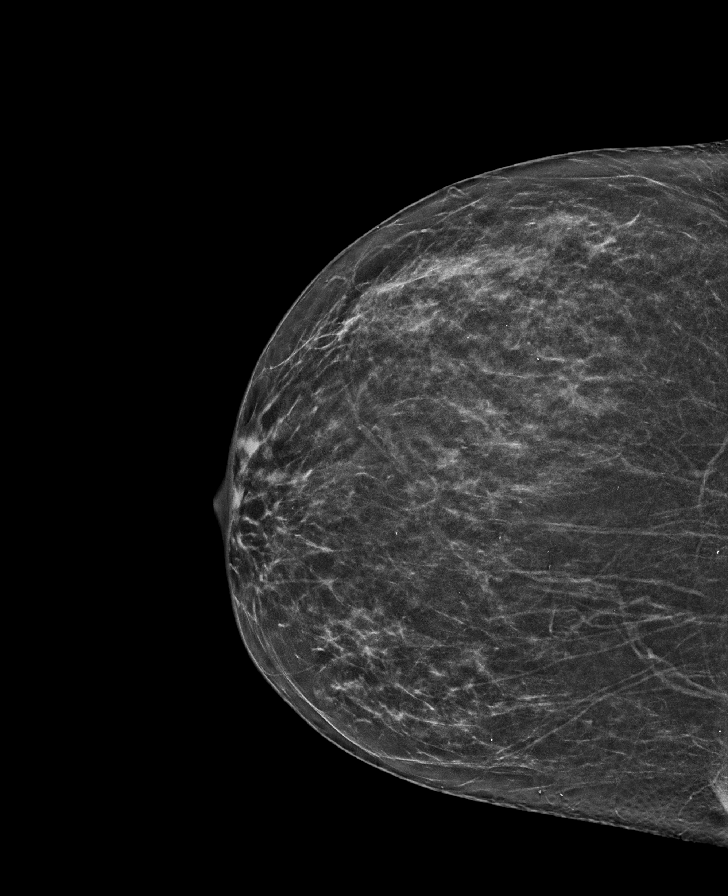

[L MLO synth-2D]
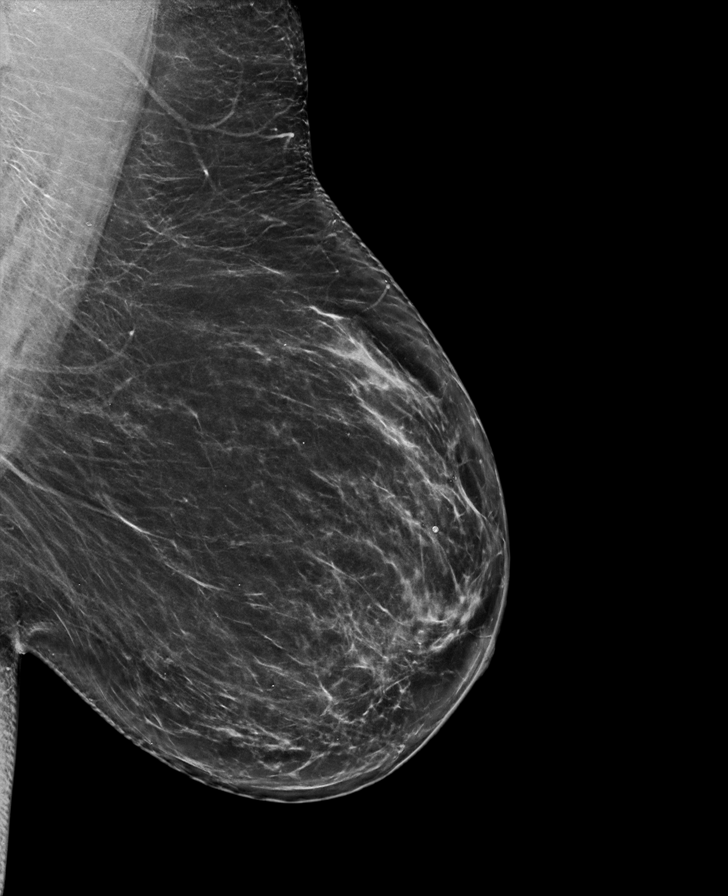

[L CC synth-2D]
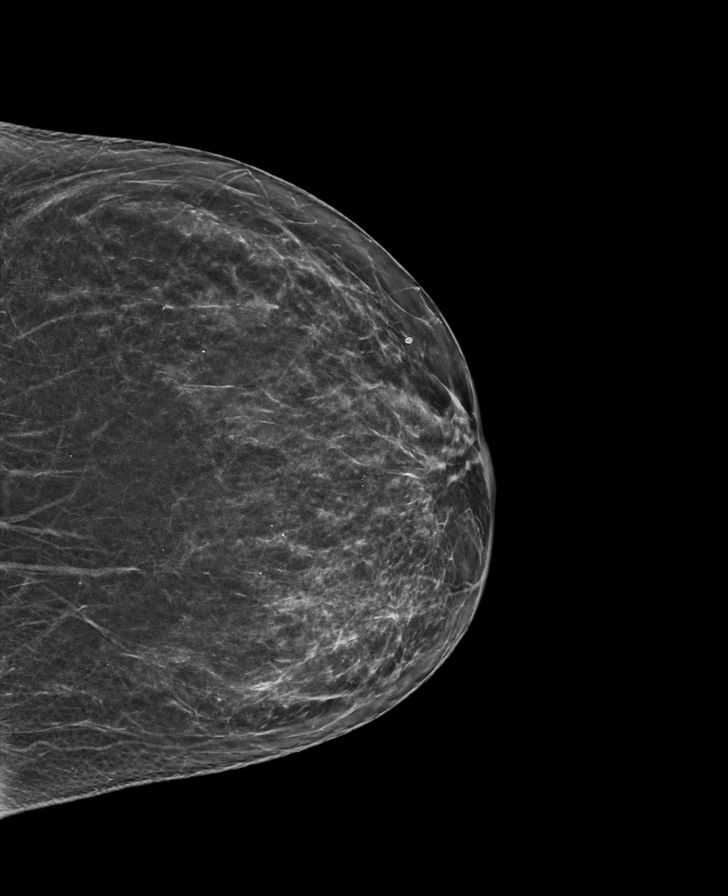

[R MLO synth-2D]
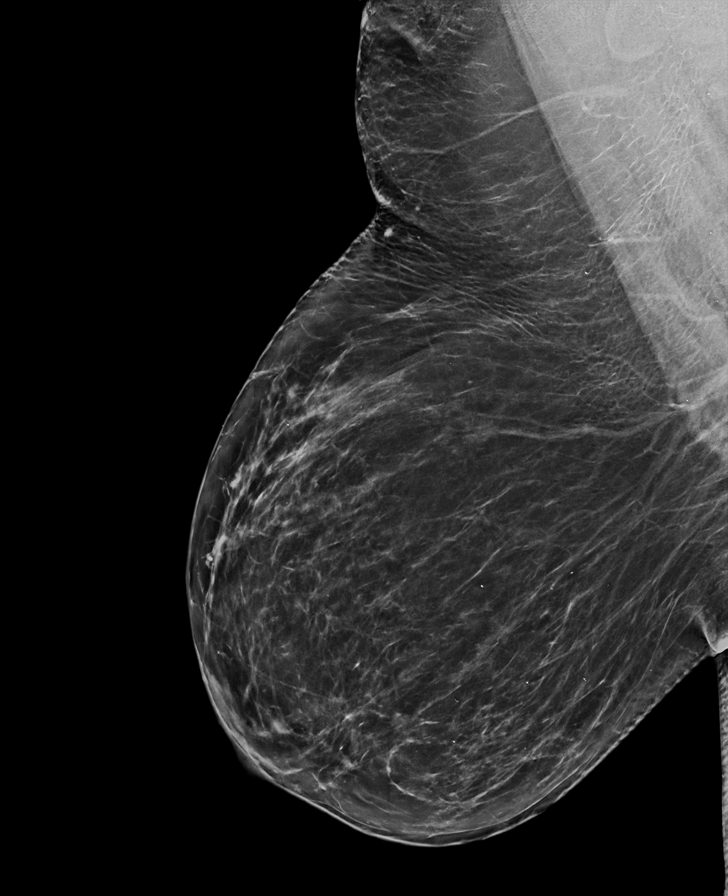

[L MLO tomo · tomo slice 40/79.0]
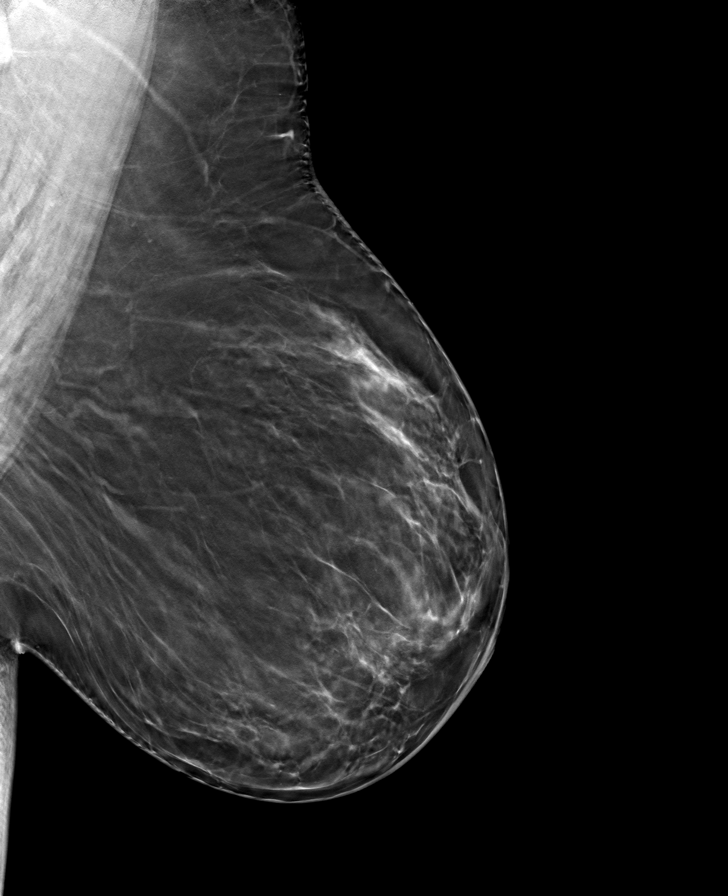

[L CC tomo · tomo slice 31/62.0]
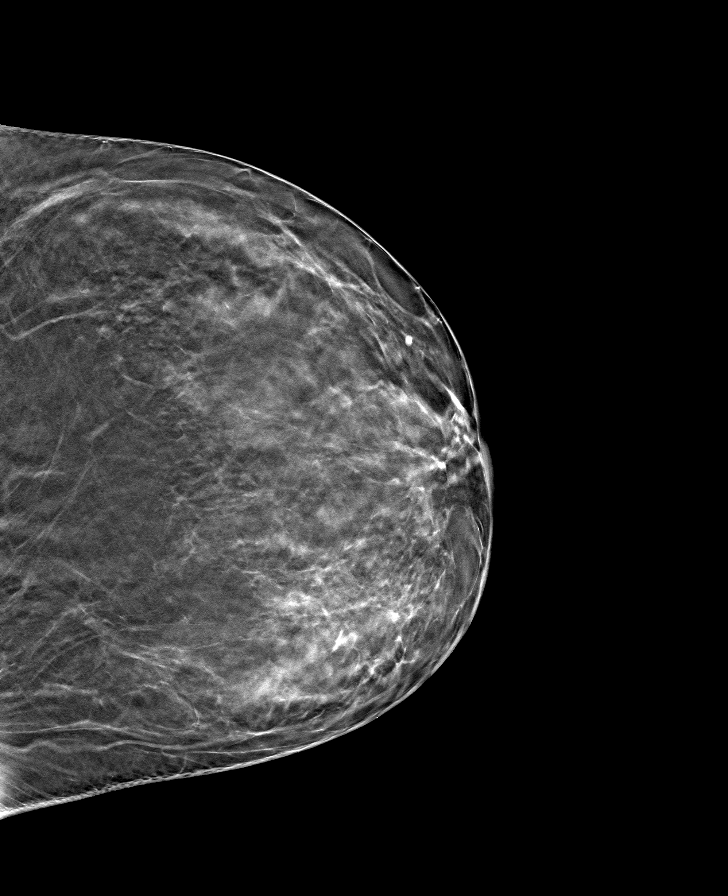

[R MLO tomo · tomo slice 41/82.0]
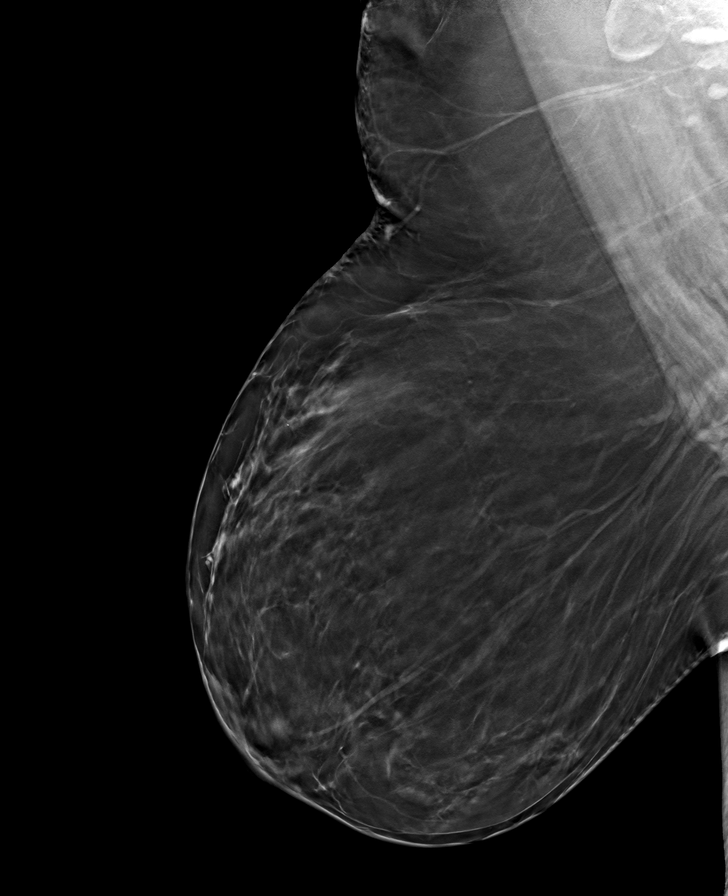

[R CC tomo · tomo slice 29/57.0]
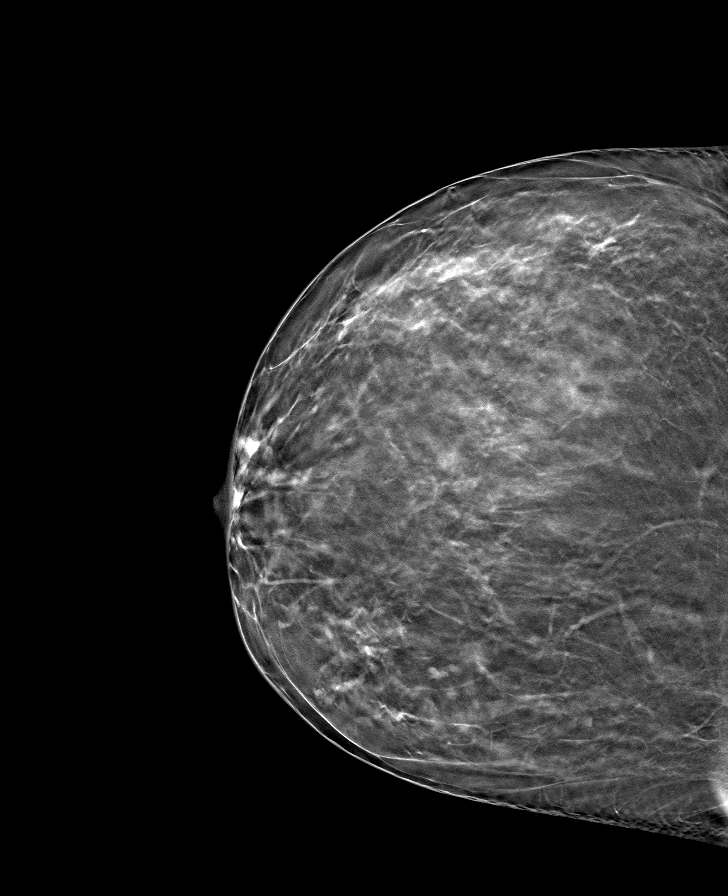

[8 of 24 positions shown; findings below may reference images not displayed]

ACR Breast Density Category b: There are scattered areas of
fibroglandular density.
FINDINGS: There are no findings suspicious for malignancy. Images were
processed with CAD.
IMPRESSION: No mammographic evidence of malignancy. A result letter of this
screening mammogram will be mailed directly to the patient.

RECOMMENDATION:
Screening mammogram in one year. (Code:[id number])

BI-RADS CATEGORY  1: Negative.

## 2022-05-01 ENCOUNTER — Ambulatory Visit (INDEPENDENT_AMBULATORY_CARE_PROVIDER_SITE_OTHER): Payer: No Typology Code available for payment source | Admitting: Sports Medicine

## 2022-05-01 DIAGNOSIS — S83206A Unspecified tear of unspecified meniscus, current injury, right knee, initial encounter: Secondary | ICD-10-CM | POA: Insufficient documentation

## 2022-05-01 DIAGNOSIS — S83241A Other tear of medial meniscus, current injury, right knee, initial encounter: Secondary | ICD-10-CM

## 2022-05-01 MED ORDER — MELOXICAM 15 MG PO TABS: ORAL_TABLET | ORAL | 3 refills | Status: AC

## 2022-05-01 NOTE — Assessment & Plan Note (Signed)
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 She had some x-rays recently that were negative. Unfortunately she continues to have medial joint line pain worse with walking, twisting. On exam she has tenderness medial joint line with a positive McMurray's sign, ligaments are stable. No effusion. Adding meloxicam, formal physical therapy, light duty at work, return to see me in about 6 weeks, injection if not better.

## 2022-05-01 NOTE — Progress Notes (Signed)
    Procedures performed today:    None.  Independent interpretation of notes and tests performed by another provider:   None.  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 She had some x-rays recently that were negative. Unfortunately she continues to have medial joint line pain worse with walking, twisting. On exam she has tenderness medial joint line with a positive McMurray's sign, ligaments are stable. No effusion. Adding meloxicam, formal physical therapy, light duty at work, return to see me in about 6 weeks, injection if not better.    ____________________________________________ Ihor Austin. Benjamin Stain, M.D., ABFM., CAQSM., AME. Primary Care and Sports Medicine [facility]  Adjunct Professor of Family Medicine  [place] [employer]  Restaurant manager, fast food

## 2022-05-24 ENCOUNTER — Ambulatory Visit: Payer: Self-pay | Admitting: Physical Therapy

## 2022-06-12 ENCOUNTER — Ambulatory Visit: Payer: No Typology Code available for payment source | Admitting: Sports Medicine

## 2022-10-23 ENCOUNTER — Emergency Department (HOSPITAL_COMMUNITY): Payer: Self-pay

## 2022-10-23 ENCOUNTER — Other Ambulatory Visit: Payer: Self-pay

## 2022-10-23 ENCOUNTER — Encounter (HOSPITAL_COMMUNITY): Payer: Self-pay

## 2022-10-23 ENCOUNTER — Emergency Department (HOSPITAL_COMMUNITY)
Admission: EM | Admit: 2022-10-23 | Discharge: 2022-10-23 | Disposition: A | Discharge status: Home / Self Care | Payer: Self-pay | Attending: Emergency Medicine | Admitting: Emergency Medicine

## 2022-10-23 DIAGNOSIS — E119 Type 2 diabetes mellitus without complications: Secondary | ICD-10-CM | POA: Insufficient documentation

## 2022-10-23 DIAGNOSIS — R1084 Generalized abdominal pain: Secondary | ICD-10-CM | POA: Insufficient documentation

## 2022-10-23 DIAGNOSIS — Z7984 Long term (current) use of oral hypoglycemic drugs: Secondary | ICD-10-CM | POA: Insufficient documentation

## 2022-10-23 DIAGNOSIS — Z79899 Other long term (current) drug therapy: Secondary | ICD-10-CM | POA: Insufficient documentation

## 2022-10-23 DIAGNOSIS — I10 Essential (primary) hypertension: Secondary | ICD-10-CM | POA: Insufficient documentation

## 2022-10-23 LAB — CBC WITH DIFFERENTIAL/PLATELET
Abs Immature Granulocytes: 0.04 10*3/uL (ref 0.00–0.07)
Basophils Absolute: 0 10*3/uL (ref 0.0–0.1)
Basophils Relative: 0 %
Eosinophils Absolute: 0.1 10*3/uL (ref 0.0–0.5)
Eosinophils Relative: 2 %
HCT: 46.2 % — ABNORMAL HIGH (ref 36.0–46.0)
Hemoglobin: 15.2 g/dL — ABNORMAL HIGH (ref 12.0–15.0)
Immature Granulocytes: 1 %
Lymphocytes Relative: 29 %
Lymphs Abs: 1.6 10*3/uL (ref 0.7–4.0)
MCH: 29.4 pg (ref 26.0–34.0)
MCHC: 32.9 g/dL (ref 30.0–36.0)
MCV: 89.4 fL (ref 80.0–100.0)
Monocytes Absolute: 0.4 10*3/uL (ref 0.1–1.0)
Monocytes Relative: 7 %
Neutro Abs: 3.3 10*3/uL (ref 1.7–7.7)
Neutrophils Relative %: 61 %
Platelets: 333 10*3/uL (ref 150–400)
RBC: 5.17 MIL/uL — ABNORMAL HIGH (ref 3.87–5.11)
RDW: 11.4 % — ABNORMAL LOW (ref 11.5–15.5)
WBC: 5.3 10*3/uL (ref 4.0–10.5)
nRBC: 0 % (ref 0.0–0.2)

## 2022-10-23 LAB — COMPREHENSIVE METABOLIC PANEL
ALT: 37 U/L (ref 0–44)
AST: 28 U/L (ref 15–41)
Albumin: 4 g/dL (ref 3.5–5.0)
Alkaline Phosphatase: 79 U/L (ref 38–126)
Anion gap: 10 (ref 5–15)
BUN: 11 mg/dL (ref 6–20)
CO2: 24 mmol/L (ref 22–32)
Calcium: 9.7 mg/dL (ref 8.9–10.3)
Chloride: 102 mmol/L (ref 98–111)
Creatinine, Ser: 0.64 mg/dL (ref 0.44–1.00)
GFR, Estimated: >60 mL/min (ref >60)
Glucose, Bld: 93 mg/dL (ref 70–99)
Potassium: 3.8 mmol/L (ref 3.5–5.1)
Sodium: 136 mmol/L (ref 135–145)
Total Bilirubin: 0.4 mg/dL (ref 0.3–1.2)
Total Protein: 7.6 g/dL (ref 6.5–8.1)

## 2022-10-23 LAB — URINALYSIS, ROUTINE W REFLEX MICROSCOPIC
Bilirubin Urine: NEGATIVE
Glucose, UA: NEGATIVE mg/dL
Hgb urine dipstick: NEGATIVE
Ketones, ur: NEGATIVE mg/dL
Leukocytes,Ua: NEGATIVE
Nitrite: NEGATIVE
Protein, ur: NEGATIVE mg/dL
Specific Gravity, Urine: 1.019 (ref 1.005–1.030)
pH: 5 (ref 5.0–8.0)

## 2022-10-23 LAB — LIPASE, BLOOD: Lipase: 35 U/L (ref 11–51)

## 2022-10-23 LAB — HCG, QUANTITATIVE, PREGNANCY: hCG, Beta Chain, Quant, S: 2 m[IU]/mL (ref <5)

## 2022-10-23 MED ORDER — OXYCODONE-ACETAMINOPHEN 5-325 MG PO TABS
1.0000 | ORAL_TABLET | Freq: Four times a day (QID) | ORAL | 0 refills | Status: AC | PRN
Start: 2022-10-23 — End: ?

## 2022-10-23 MED ORDER — ONDANSETRON HCL 4 MG/2ML IJ SOLN
4.0000 mg | Freq: Once | INTRAMUSCULAR | Status: AC
  Administered 2022-10-23: 4 mg via INTRAVENOUS
  Filled 2022-10-23: qty 2

## 2022-10-23 MED ORDER — ONDANSETRON 4 MG PO TBDP
4.0000 mg | ORAL_TABLET | Freq: Three times a day (TID) | ORAL | 0 refills | Status: AC | PRN
Start: 2022-10-23 — End: ?

## 2022-10-23 MED ORDER — MORPHINE SULFATE (PF) 4 MG/ML IV SOLN
4.0000 mg | Freq: Once | INTRAVENOUS | Status: AC
  Administered 2022-10-23: 4 mg via INTRAVENOUS
  Filled 2022-10-23: qty 1

## 2022-10-23 MED ORDER — NAPROXEN 500 MG PO TABS
500.0000 mg | ORAL_TABLET | Freq: Two times a day (BID) | ORAL | 0 refills | Status: AC
Start: 2022-10-23 — End: ?

## 2022-10-23 MED ORDER — IOHEXOL 350 MG/ML SOLN
75.0000 mL | Freq: Once | INTRAVENOUS | Status: AC | PRN
  Administered 2022-10-23: 75 mL via INTRAVENOUS

## 2022-10-23 NOTE — ED Provider Notes (Signed)
[facility] AT [facility] Provider Note   CSN: [id number] Arrival date & time: 10/23/22  1156     History  Chief Complaint  Patient presents with   Abdominal Pain    Kelly Serrano is a 47 y.o. female history of diabetes, hypertension presents today for evaluation of abdominal pain.  Patient reports right upper quadrant abdominal pain in last 2 days.  History of cholecystectomy 13 years ago.  Endorses nausea without vomiting.  Last bowel movement was this morning.  Denies any constipation or diarrhea, blood in her stool or urine.  Denies any urinary symptoms, abnormal vaginal discharge, vaginal bleeding, fever, cold/chills.   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   Diabetes (HCC)    DIZZINESS 05/19/2009   Qualifier: Diagnosis of  By: Burnadette Pop  MD, [facility]     HEARING DEFICIT 05/31/2009   Qualifier: Diagnosis of  By: Burnadette Pop  MD, [place]     Hypertension    Past Surgical History:  Procedure Laterality Date   CESAREAN SECTION  2008   CHOLECYSTECTOMY  08/28/2011   CHOLECYSTECTOMY  08/28/2011   Procedure: LAPAROSCOPIC CHOLECYSTECTOMY WITH INTRAOPERATIVE CHOLANGIOGRAM;  Surgeon: Liz Malady, MD;  Location: [facility] OR;  Service: General;  Laterality: N/A;     Home Medications Prior to Admission medications   Medication Sig Start Date End Date Taking? Authorizing Provider  lisinopril-hydrochlorothiazide (ZESTORETIC) 20-12.5 MG tablet Take 1 tablet by mouth daily. 02/21/19   Verdene Lennert, MD  meloxicam (MOBIC) 15 MG tablet One tab PO every 24 hours with a meal for 2 weeks, then once every 24 hours prn pain. 05/01/22   Monica Becton, MD  metFORMIN (GLUCOPHAGE) 500 MG tablet Take 1 tablet (500 mg total) by mouth 2 (two) times daily with a meal. 02/21/19   Verdene Lennert,  MD      Allergies    Patient has no known allergies.    Review of Systems   Review of Systems  Gastrointestinal:  Positive for abdominal pain.    Physical Exam Updated Vital Signs BP 124/83 (BP Location: Right Arm)   Pulse 93   Temp 98.2 F (36.8 C)   Resp 16   Ht 5' (1.524 m)   Wt 77.1 kg   LMP 06/10/2022 (Approximate) Comment: pt states she has had irregular periods for approximately 1 year  SpO2 100%   BMI 33.20 kg/m  Physical Exam Vitals and nursing note reviewed.  Constitutional:      Appearance: Normal appearance.  HENT:     Head: Normocephalic and atraumatic.     Mouth/Throat:     Mouth: Mucous membranes are moist.  Eyes:     General: No scleral icterus. Cardiovascular:     Rate and Rhythm: Normal rate and regular rhythm.     Pulses: Normal pulses.     Heart sounds: Normal heart sounds.  Pulmonary:     Effort: Pulmonary effort is normal.     Breath sounds: Normal breath sounds.  Abdominal:     General: Abdomen is flat.     Palpations: Abdomen is soft.     Tenderness: There is abdominal tenderness in the right upper quadrant.  Musculoskeletal:        General: No deformity.  Skin:    General: Skin is warm.     Findings: No rash.  Neurological:  General: No focal deficit present.     Mental Status: She is alert.  Psychiatric:        Mood and Affect: Mood normal.     ED Results / Procedures / Treatments   Labs (all labs ordered are listed, but only abnormal results are displayed) Labs Reviewed  CBC WITH DIFFERENTIAL/PLATELET - Abnormal; Notable for the following components:      Result Value   RBC 5.17 (*)    Hemoglobin 15.2 (*)    HCT 46.2 (*)    RDW 11.4 (*)    All other components within normal limits  LIPASE, BLOOD  COMPREHENSIVE METABOLIC PANEL  URINALYSIS, ROUTINE W REFLEX MICROSCOPIC  HCG, QUANTITATIVE, PREGNANCY    EKG None  Radiology No results found.  Procedures Procedures    Medications Ordered in ED Medications -  No data to display  ED Course/ Medical Decision Making/ A&P                                 Medical Decision Making Amount and/or Complexity of Data Reviewed Radiology: ordered.  Risk Prescription drug management.   This patient presents to the ED for abdominal pain, this involves an extensive number of treatment options, and is a complaint that carries with a high risk of complications and morbidity.  The differential diagnosis includes cholecystitis, cholelithiasis, SBO, kidney stone, diverticulitis, diverticulosis, gastroenteritis, peptic ulcer disease, pyelonephritis, infectious etiology.  This is not an exhaustive list.  Lab tests: I ordered and personally interpreted labs.  The pertinent results include: WBC unremarkable. Hbg unremarkable. Platelets unremarkable. Electrolytes unremarkable. BUN, creatinine unremarkable.  hCG is normal.  Imaging studies: I ordered imaging studies, personally reviewed, interpreted imaging and agree with the radiologist's interpretations. The results include: Ultrasound right upper quadrant, CT angio abdomen pelvis are unremarkable.  Problem list/ ED course/ Critical interventions/ Medical management: HPI: See above Vital signs within normal range and stable throughout visit. Laboratory/imaging studies significant for: See above. On physical examination, patient is afebrile and appears in no acute distress. Abdominal exam without peritoneal signs. No evidence of acute abdomen at this time. Well appearing. Unlikely acute pancreatitis (neg lipase), PUD (including gastric perforation), acute infectious processes (pneumonia, hepatitis, pyelonephritis), atypical appendicitis, vascular catastrophe, bowel obstruction or viscus perforation, or acute coronary syndrome. Presentation not consistent with other acute, emergent causes of abdominal pain at this time. Given Zofran, morphine.  Reevaluation of patient of these medications showed that the patient improved.  Advised patient to take Tylenol/ibuprofen/naproxen for pain, follow-up with primary care physician for further evaluation and management, return to the ER if new or worsening symptoms. I have reviewed the patient home medicines and have made adjustments as needed.  Cardiac monitoring/EKG: The patient was maintained on a cardiac monitor.  I personally reviewed and interpreted the cardiac monitor which showed an underlying rhythm of: sinus rhythm.  Additional history obtained: External records from outside source obtained and reviewed including: Chart review including previous notes, labs, imaging.  Consultations obtained:  Disposition Continued outpatient therapy. Follow-up with PCP recommended for reevaluation of symptoms. Treatment plan discussed with patient.  Pt acknowledged understanding was agreeable to the plan. Worrisome signs and symptoms were discussed with patient, and patient acknowledged understanding to return to the ED if they noticed these signs and symptoms. Patient was stable upon discharge.   This chart was dictated using voice recognition software.  Despite best efforts to proofread,  errors can occur which  can change the documentation meaning.          Final Clinical Impression(s) / ED Diagnoses Final diagnoses:  Generalized abdominal pain    Rx / DC Orders ED Discharge Orders          Ordered    naproxen (NAPROSYN) 500 MG tablet  2 times daily        10/23/22 2145    oxyCODONE-acetaminophen (PERCOCET/ROXICET) 5-325 MG tablet  Every 6 hours PRN        10/23/22 2145    ondansetron (ZOFRAN-ODT) 4 MG disintegrating tablet  Every 8 hours PRN        10/23/22 2145              Jeanelle Malling, PA 10/23/22 2204    Gerhard Munch, MD 10/23/22 2313

## 2022-10-23 NOTE — ED Notes (Signed)
Pt bp reading low, pt states she feels fine but still has deep pain

## 2022-10-23 NOTE — ED Notes (Signed)
Pt in NAD at d/c from ED. A&O. Ambulatory. Respirations even & unlabored. Skin warm & dry. Pt verbalized understanding of d/c teaching including follow up care, medications and reasons to return to the ED. No needs or questions expressed at d/c. Pt declined interpreter at d/c.

## 2022-10-23 NOTE — ED Triage Notes (Signed)
Pt c/o right side pain x 2 days. Pt has had nausea. Pt denies vomiting, diarrhea or constipation.

## 2022-10-23 NOTE — ED Provider Triage Note (Signed)
Emergency Medicine Provider Triage Evaluation Note  Kelly Serrano , a 47 y.o. female  was evaluated in triage.  Pt complains of right side/flank pain for the past 2 days.  She has had nausea without vomiting.  Denies urinary symptoms.  She has had a cholecystectomy.  Review of Systems  Positive: As above Negative: As above  Physical Exam  BP 124/83 (BP Location: Right Arm)   Pulse 93   Temp 98.2 F (36.8 C)   Resp 16   Ht 5' (1.524 m)   Wt 77.1 kg   LMP 06/10/2022 (Approximate) Comment: pt states she has had irregular periods for approximately 1 year  SpO2 100%   BMI 33.20 kg/m  Gen:   Awake, no distress   Resp:  Normal effort  MSK:   Moves extremities without difficulty  Other:  Right upper quadrant tenderness with palpation, abdomen is soft and nondistended  Medical Decision Making  Medically screening exam initiated at 12:15 PM.  Appropriate orders placed.  Kelly Serrano was informed that the remainder of the evaluation will be completed by another provider, this initial triage assessment does not replace that evaluation, and the importance of remaining in the ED until their evaluation is complete.     Melton Alar R, PA-C 10/23/22 1216

## 2022-10-23 NOTE — Discharge Instructions (Addendum)
Please take tylenol/naproxen or Percocet as needed for pain. I recommend close follow-up with PCP for reevaluation.  Please do not hesitate to return to emergency department if worrisome signs symptoms we discussed become apparent.

## 2023-09-11 ENCOUNTER — Encounter: Payer: Self-pay | Admitting: Sports Medicine
